# Patient Record
Sex: Male | Born: 1990 | Race: White | Hispanic: No | Marital: Single | Smoking: Never smoker
Health system: Southern US, Academic
[De-identification: ages and names within clinical notes are randomized; demographics above are authoritative.]

## PROBLEM LIST (undated history)

## (undated) DIAGNOSIS — I10 Essential (primary) hypertension: Secondary | ICD-10-CM

## (undated) DIAGNOSIS — F70 Mild intellectual disabilities: Secondary | ICD-10-CM

## (undated) DIAGNOSIS — R569 Unspecified convulsions: Secondary | ICD-10-CM

## (undated) HISTORY — PX: HX ADENOIDECTOMY: SHX29

---

## 2011-03-23 ENCOUNTER — Emergency Department (HOSPITAL_COMMUNITY)
Admission: EM | Admit: 2011-03-23 | Discharge: 2011-03-23 | Disposition: A | Discharge status: Home / Self Care | Payer: Medicaid - Out of State | Attending: Emergency Medicine | Admitting: Emergency Medicine

## 2011-03-23 DIAGNOSIS — L298 Other pruritus: Secondary | ICD-10-CM | POA: Insufficient documentation

## 2011-03-23 DIAGNOSIS — L2989 Other pruritus: Secondary | ICD-10-CM | POA: Insufficient documentation

## 2011-03-23 DIAGNOSIS — Z79899 Other long term (current) drug therapy: Secondary | ICD-10-CM | POA: Insufficient documentation

## 2011-03-23 DIAGNOSIS — F341 Dysthymic disorder: Secondary | ICD-10-CM | POA: Insufficient documentation

## 2011-03-23 DIAGNOSIS — R21 Rash and other nonspecific skin eruption: Secondary | ICD-10-CM | POA: Insufficient documentation

## 2011-03-23 DIAGNOSIS — M7989 Other specified soft tissue disorders: Secondary | ICD-10-CM | POA: Insufficient documentation

## 2011-03-23 DIAGNOSIS — L02419 Cutaneous abscess of limb, unspecified: Secondary | ICD-10-CM | POA: Insufficient documentation

## 2011-03-23 DIAGNOSIS — M79609 Pain in unspecified limb: Secondary | ICD-10-CM | POA: Insufficient documentation

## 2011-03-25 ENCOUNTER — Inpatient Hospital Stay (INDEPENDENT_AMBULATORY_CARE_PROVIDER_SITE_OTHER)
Admission: RE | Admit: 2011-03-25 | Discharge: 2011-03-25 | Disposition: A | Discharge status: Home / Self Care | Payer: Medicare Other | Source: Ambulatory Visit | Attending: Family Medicine | Admitting: Family Medicine

## 2011-03-25 DIAGNOSIS — L02419 Cutaneous abscess of limb, unspecified: Secondary | ICD-10-CM

## 2011-09-10 ENCOUNTER — Emergency Department (HOSPITAL_COMMUNITY): Payer: Medicare Other

## 2011-09-10 ENCOUNTER — Encounter (HOSPITAL_COMMUNITY): Payer: Self-pay

## 2011-09-10 ENCOUNTER — Emergency Department (HOSPITAL_COMMUNITY)
Admission: EM | Admit: 2011-09-10 | Discharge: 2011-09-10 | Disposition: A | Discharge status: Home / Self Care | Payer: Medicare Other | Attending: Emergency Medicine | Admitting: Emergency Medicine

## 2011-09-10 DIAGNOSIS — R221 Localized swelling, mass and lump, neck: Secondary | ICD-10-CM | POA: Insufficient documentation

## 2011-09-10 DIAGNOSIS — R220 Localized swelling, mass and lump, head: Secondary | ICD-10-CM | POA: Insufficient documentation

## 2011-09-10 DIAGNOSIS — F70 Mild intellectual disabilities: Secondary | ICD-10-CM | POA: Insufficient documentation

## 2011-09-10 DIAGNOSIS — S0993XA Unspecified injury of face, initial encounter: Secondary | ICD-10-CM

## 2011-09-10 HISTORY — DX: Unspecified convulsions: R56.9

## 2011-09-10 HISTORY — DX: Mild intellectual disabilities: F70

## 2011-09-10 MED ORDER — TETANUS-DIPHTH-ACELL PERTUSSIS 5-2.5-18.5 LF-MCG/0.5 IM SUSP
0.5000 mL | Freq: Once | INTRAMUSCULAR | Status: AC
  Administered 2011-09-10: 0.5 mL via INTRAMUSCULAR
  Filled 2011-09-10: qty 0.5

## 2011-09-10 MED ORDER — IBUPROFEN 800 MG PO TABS
800.0000 mg | ORAL_TABLET | Freq: Once | ORAL | Status: AC
  Administered 2011-09-10: 800 mg via ORAL
  Filled 2011-09-10: qty 1

## 2011-09-10 MED ORDER — IBUPROFEN 600 MG PO TABS: 600.0000 mg | ORAL_TABLET | Freq: Four times a day (QID) | ORAL | Status: AC | PRN
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
## 2011-09-10 NOTE — ED Provider Notes (Signed)
Medical screening examination/treatment/procedure(s) were performed by non-physician practitioner and as supervising physician I was immediately available for consultation/collaboration.   Jeslynn Hollander A. Laketa Sandoz, MD 09/10/11 1907 
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
## 2011-10-18 ENCOUNTER — Encounter (HOSPITAL_COMMUNITY): Payer: Self-pay | Admitting: Emergency Medicine

## 2011-10-18 ENCOUNTER — Emergency Department (HOSPITAL_COMMUNITY)
Admission: EM | Admit: 2011-10-18 | Discharge: 2011-10-19 | Disposition: A | Discharge status: Home / Self Care | Payer: Medicare Other | Attending: Emergency Medicine | Admitting: Emergency Medicine

## 2011-10-18 DIAGNOSIS — R569 Unspecified convulsions: Secondary | ICD-10-CM | POA: Insufficient documentation

## 2011-10-18 DIAGNOSIS — F70 Mild intellectual disabilities: Secondary | ICD-10-CM | POA: Insufficient documentation

## 2011-10-18 DIAGNOSIS — L02219 Cutaneous abscess of trunk, unspecified: Secondary | ICD-10-CM | POA: Insufficient documentation

## 2011-10-18 DIAGNOSIS — L02213 Cutaneous abscess of chest wall: Secondary | ICD-10-CM

## 2011-10-18 NOTE — ED Notes (Signed)
Pt c/o abcess to right upper abd. Onset Mon.  [facility] has drained some

## 2011-10-19 MED ORDER — CLINDAMYCIN HCL 150 MG PO CAPS: 300.0000 mg | ORAL_CAPSULE | Freq: Three times a day (TID) | ORAL | Status: AC

## 2011-10-19 NOTE — Discharge Instructions (Signed)
You were seen and treated today for a skin infection called an Abscess. Use warm compress over the area for 5 times a day for 20 minutes to help increase blood flow and fight the infection. Please take an antibiotic that you were prescribed for the full length of time. Return if you have any increased redness, swelling or pain to the area.  Abscess An abscess (boil or furuncle) is an infected area that contains a collection of pus.  SYMPTOMS Signs and symptoms of an abscess include pain, tenderness, redness, or hardness. You may feel a moveable soft area under your skin. An abscess can occur anywhere in the body.  TREATMENT  A surgical cut (incision) may be made over your abscess to drain the pus. Gauze may be packed into the space or a drain may be looped through the abscess cavity (pocket). This provides a drain that will allow the cavity to heal from the inside outwards. The abscess may be painful for a few days, but should feel much better if it was drained.  Your abscess, if seen early, may not have localized and may not have been drained. If not, another appointment may be required if it does not get better on its own or with medications. HOME CARE INSTRUCTIONS   Only take over-the-counter or prescription medicines for pain, discomfort, or fever as directed by your caregiver.   Take your antibiotics as directed if they were prescribed. Finish them even if you start to feel better.   Keep the skin and clothes clean around your abscess.   If the abscess was drained, you will need to use gauze dressing to collect any draining pus. Dressings will typically need to be changed 3 or more times a day.   The infection may spread by skin contact with others. Avoid skin contact as much as possible.   Practice good hygiene. This includes regular hand washing, cover any draining skin lesions, and do not share personal care items.   If you participate in sports, do not share athletic equipment,  towels, whirlpools, or personal care items. Shower after every practice or tournament.   If a draining area cannot be adequately covered:   Do not participate in sports.   Children should not participate in day care until the wound has healed or drainage stops.   If your caregiver has given you a follow-up appointment, it is very important to keep that appointment. Not keeping the appointment could result in a much worse infection, chronic or permanent injury, pain, and disability. If there is any problem keeping the appointment, you must call back to this facility for assistance.  SEEK MEDICAL CARE IF:   You develop increased pain, swelling, redness, drainage, or bleeding in the wound site.   You develop signs of generalized infection including muscle aches, chills, fever, or a general ill feeling.   You have an oral temperature above 102 F (38.9 C).  MAKE SURE YOU:   Understand these instructions.   Will watch your condition.   Will get help right away if you are not doing well or get worse.  Document Released: 05/03/2005 Document Revised: 07/13/2011 Document Reviewed: 02/25/2008 [facility] Patient Information 2012 [place], [place].

## 2011-10-19 NOTE — ED Provider Notes (Signed)
History     CSN: [id number]  Arrival date & time 10/18/11  2139   First MD Initiated Contact with Patient 10/19/11 0113      Chief Complaint  Patient presents with  . Abscess    HPI  History provided by the patient and father. Patient is a 21 year old male with history of seizure disorder who presents with concerns for skin infection on right chest wall the past 3 days.  Pt first noticed a small "pimple" like lesion to rt chest wall under nipple area.  The area began to have swelling and increassed redness.  Pt reports trying to pop the area by using a needle.  He reports small amounts of pus coming out but by the next day had increased redness and pain to area.  Pt denies having any fever, chills, or sweats.  Pt has had similar lesions on skin before requiring I&D.  Pt denies any other aggravating or alleviating factors.    Past Medical History  Diagnosis Date  . Mental retardation, mild (I.Q. 50-70)   . Seizures     History reviewed. No pertinent past surgical history.  No family history on file.  History  Substance Use Topics  . Smoking status: Not on file  . Smokeless tobacco: Not on file  . Alcohol Use: Yes      Review of Systems  Constitutional: Negative for fever and chills.  All other systems reviewed and are negative.    Allergies  Review of patient's allergies indicates no known allergies.  Home Medications   Current Outpatient Rx  Name Route Sig Dispense Refill  . CYCLOBENZAPRINE HCL 5 MG PO TABS Oral Take 5 mg by mouth 3 (three) times daily as needed. For sleep    . IBUPROFEN 200 MG PO TABS Oral Take 200 mg by mouth every 6 (six) hours as needed. For pain      BP 140/94  Pulse 95  Temp(Src) 99.5 F (37.5 C) (Oral)  Resp 18  SpO2 97%  Physical Exam  Nursing note and vitals reviewed. Constitutional: He appears well-developed and well-nourished. No distress.  HENT:  Head: Normocephalic.  Cardiovascular: Normal rate and regular rhythm.     Pulmonary/Chest: Effort normal and breath sounds normal. No respiratory distress. He has no wheezes. He has no rales.         2 cm fluctuant nodule with surrounding erythema and induration of skin.  Area TTP.  Skin: Skin is warm.  Psychiatric: He has a normal mood and affect. His behavior is normal.    ED Course  Procedures   INCISION AND DRAINAGE Performed by: Angus Seller Consent: Verbal consent obtained. Risks and benefits: risks, benefits and alternatives were discussed Type: abscess  Body area: right chest wall  Anesthesia: local infiltration  Local anesthetic: lidocaine 2% with epinephrine  Anesthetic total: 4 ml  Complexity: complex Blunt dissection to break up loculations  Drainage: purulent  Drainage amount: small to moderate  Packing material: none  Patient tolerance: Patient tolerated the procedure well with no immediate complications.       1. Abscess of chest wall       MDM  1:40 AM patient seen and evaluated. Patient no acute distress.        Angus Seller, [place] 10/19/11 [phone]

## 2011-10-20 NOTE — ED Provider Notes (Signed)
Medical screening examination/treatment/procedure(s) were performed by non-physician practitioner and as supervising physician I was immediately available for consultation/collaboration.  Rickelle Sylvestre T Graceann Boileau, MD 10/20/11 0901 

## 2013-04-02 ENCOUNTER — Encounter (HOSPITAL_COMMUNITY): Payer: Self-pay | Admitting: *Deleted

## 2013-04-02 ENCOUNTER — Emergency Department (HOSPITAL_COMMUNITY)
Admission: EM | Admit: 2013-04-02 | Discharge: 2013-04-02 | Disposition: A | Discharge status: Home / Self Care | Payer: Medicare Other | Attending: Emergency Medicine | Admitting: Emergency Medicine

## 2013-04-02 DIAGNOSIS — Z8669 Personal history of other diseases of the nervous system and sense organs: Secondary | ICD-10-CM | POA: Insufficient documentation

## 2013-04-02 DIAGNOSIS — L0291 Cutaneous abscess, unspecified: Secondary | ICD-10-CM

## 2013-04-02 DIAGNOSIS — R110 Nausea: Secondary | ICD-10-CM | POA: Insufficient documentation

## 2013-04-02 DIAGNOSIS — L02419 Cutaneous abscess of limb, unspecified: Secondary | ICD-10-CM | POA: Insufficient documentation

## 2013-04-02 DIAGNOSIS — R21 Rash and other nonspecific skin eruption: Secondary | ICD-10-CM | POA: Insufficient documentation

## 2013-04-02 DIAGNOSIS — R509 Fever, unspecified: Secondary | ICD-10-CM | POA: Insufficient documentation

## 2013-04-02 DIAGNOSIS — Z8659 Personal history of other mental and behavioral disorders: Secondary | ICD-10-CM | POA: Insufficient documentation

## 2013-04-02 MED ORDER — SULFAMETHOXAZOLE-TRIMETHOPRIM 800-160 MG PO TABS: 1.0000 | ORAL_TABLET | Freq: Two times a day (BID) | ORAL | Status: AC

## 2013-04-02 MED ORDER — CEPHALEXIN 500 MG PO CAPS: 500.0000 mg | ORAL_CAPSULE | Freq: Four times a day (QID) | ORAL | Status: AC

## 2013-04-02 NOTE — ED Provider Notes (Signed)
CSN: [id number]     Arrival date & time 04/02/13  0114 History   First MD Initiated Contact with Patient 04/02/13 0124     Chief Complaint  Patient presents with  . Recurrent Skin Infections   (Consider location/radiation/quality/duration/timing/severity/associated sxs/prior Treatment) HPI Comments: Patient is a 22 year old male who presents today with 2 weeks of worsening abscess. He reports the pain feels similar to when he got a bug bite on his other leg. The area of erythema surrounding his abscess has continuned to grow over the past 2 weeks. He reports nausea without vomiting. He has not done anything to make his pain better. Walking makes his pain worse. He had a subjective fever today. Afebrile in triage.   The history is provided by the patient. No language interpreter was used.    Past Medical History  Diagnosis Date  . Mental retardation, mild (I.Q. 50-70)   . Seizures    History reviewed. No pertinent past surgical history. No family history on file. History  Substance Use Topics  . Smoking status: Never Smoker   . Smokeless tobacco: Not on file  . Alcohol Use: Yes    Review of Systems  Constitutional: Positive for fever (subjective). Negative for chills.  Respiratory: Negative for shortness of breath.   Cardiovascular: Negative for chest pain.  Gastrointestinal: Positive for nausea. Negative for vomiting and abdominal pain.  Skin: Positive for rash.  All other systems reviewed and are negative.    Allergies  Naproxen  Home Medications  No current outpatient prescriptions on file. BP 130/90  Pulse 88  Temp(Src) 98.3 F (36.8 C) (Oral)  Resp 18  SpO2 96% Physical Exam  Nursing note and vitals reviewed. Constitutional: He is oriented to person, place, and time. He appears well-developed and well-nourished. No distress.  HENT:  Head: Normocephalic and atraumatic.  Right Ear: External ear normal.  Left Ear: External ear normal.  Nose: Nose normal.   Eyes: Conjunctivae are normal.  Neck: Normal range of motion. No tracheal deviation present.  Cardiovascular: Normal rate, regular rhythm and normal heart sounds.   Pulmonary/Chest: Effort normal and breath sounds normal. No stridor.  Abdominal: Soft. He exhibits no distension. There is no tenderness.  Musculoskeletal: Normal range of motion.  Neurological: He is alert and oriented to person, place, and time.  Skin: Skin is warm and dry. He is not diaphoretic.  6 cm area of erythema with central 2 cm area of fluctuance on medial left thigh. No streaking. TTP. No induration.   Psychiatric: He has a normal mood and affect. His behavior is normal.    ED Course  Procedures (including critical care time)  INCISION AND DRAINAGE Performed by: Junious Silk Consent: Verbal consent obtained. Risks and benefits: risks, benefits and alternatives were discussed Type: abscess  Body area: left thigh  Anesthesia: local infiltration  Incision was made with a scalpel.  Local anesthetic: lidocaine 2% 1% epinephrine  Anesthetic total: 3 ml  Complexity: complex Blunt dissection to break up loculations  Drainage: purulent  Drainage amount: moderate  Patient tolerance: Patient tolerated the procedure well with no immediate complications.     Labs Review Labs Reviewed - No data to display Imaging Review No results found.  MDM   1. Abscess and cellulitis    Patient with skin abscess amenable to incision and drainage.  Abscess was not large enough to warrant packing or drain,  wound recheck in 2 days. Encouraged home warm soaks and flushing.  Signs of cellulitis  is surrounding skin.  Will d/c to home.  Given Bactrim and Keflex.   Mora Bellman, PA-C 04/02/13 [phone]

## 2013-04-02 NOTE — ED Notes (Signed)
Patient with an abcess to left inner thigh.  Would is intact

## 2013-04-02 NOTE — ED Provider Notes (Signed)
Medical screening examination/treatment/procedure(s) were performed by non-physician practitioner and as supervising physician I was immediately available for consultation/collaboration.   Echo Allsbrook, MD 04/02/13 0640 

## 2013-04-04 DIAGNOSIS — R197 Diarrhea, unspecified: Secondary | ICD-10-CM | POA: Insufficient documentation

## 2013-04-04 DIAGNOSIS — Z792 Long term (current) use of antibiotics: Secondary | ICD-10-CM | POA: Insufficient documentation

## 2013-04-04 DIAGNOSIS — R112 Nausea with vomiting, unspecified: Secondary | ICD-10-CM | POA: Insufficient documentation

## 2013-04-04 DIAGNOSIS — Z8669 Personal history of other diseases of the nervous system and sense organs: Secondary | ICD-10-CM | POA: Insufficient documentation

## 2013-04-04 DIAGNOSIS — R109 Unspecified abdominal pain: Secondary | ICD-10-CM | POA: Insufficient documentation

## 2013-04-04 DIAGNOSIS — Z8659 Personal history of other mental and behavioral disorders: Secondary | ICD-10-CM | POA: Insufficient documentation

## 2013-04-05 ENCOUNTER — Emergency Department (HOSPITAL_COMMUNITY)
Admission: EM | Admit: 2013-04-05 | Discharge: 2013-04-05 | Disposition: A | Discharge status: Home / Self Care | Payer: Medicare Other | Attending: Emergency Medicine | Admitting: Emergency Medicine

## 2013-04-05 ENCOUNTER — Encounter (HOSPITAL_COMMUNITY): Payer: Self-pay | Admitting: *Deleted

## 2013-04-05 DIAGNOSIS — R112 Nausea with vomiting, unspecified: Secondary | ICD-10-CM

## 2013-04-05 LAB — URINALYSIS, ROUTINE W REFLEX MICROSCOPIC
Bilirubin Urine: NEGATIVE
Glucose, UA: NEGATIVE mg/dL
Hgb urine dipstick: NEGATIVE
Specific Gravity, Urine: 1.021 (ref 1.005–1.030)
Urobilinogen, UA: 1 mg/dL (ref 0.0–1.0)
pH: 7 (ref 5.0–8.0)

## 2013-04-05 LAB — CBC WITH DIFFERENTIAL/PLATELET
Basophils Absolute: 0.1 10*3/uL (ref 0.0–0.1)
Basophils Relative: 1 % (ref 0–1)
Eosinophils Absolute: 0.7 10*3/uL (ref 0.0–0.7)
Eosinophils Relative: 9 % — ABNORMAL HIGH (ref 0–5)
HCT: 39.8 % (ref 39.0–52.0)
Lymphocytes Relative: 35 % (ref 12–46)
MCHC: 33.7 g/dL (ref 30.0–36.0)
MCV: 85 fL (ref 78.0–100.0)
Monocytes Absolute: 0.9 10*3/uL (ref 0.1–1.0)
Platelets: 288 10*3/uL (ref 150–400)
RDW: 12.9 % (ref 11.5–15.5)
WBC: 8.3 10*3/uL (ref 4.0–10.5)

## 2013-04-05 LAB — COMPREHENSIVE METABOLIC PANEL
ALT: 29 U/L (ref 0–53)
AST: 20 U/L (ref 0–37)
Albumin: 3.8 g/dL (ref 3.5–5.2)
Calcium: 9.5 mg/dL (ref 8.4–10.5)
Creatinine, Ser: 0.76 mg/dL (ref 0.50–1.35)
GFR calc non Af Amer: >90 mL/min (ref >90)
Sodium: 139 mEq/L (ref 135–145)
Total Protein: 6.9 g/dL (ref 6.0–8.3)

## 2013-04-05 MED ORDER — ONDANSETRON 8 MG PO TBDP: 8.0000 mg | ORAL_TABLET | Freq: Three times a day (TID) | ORAL | Status: AC | PRN

## 2013-04-05 MED ORDER — ONDANSETRON HCL 4 MG/2ML IJ SOLN
4.0000 mg | Freq: Once | INTRAMUSCULAR | Status: AC
  Administered 2013-04-05: 4 mg via INTRAVENOUS
  Filled 2013-04-05: qty 2

## 2013-04-05 MED ORDER — SODIUM CHLORIDE 0.9 % IV BOLUS (SEPSIS)
1000.0000 mL | Freq: Once | INTRAVENOUS | Status: AC
  Administered 2013-04-05: 1000 mL via INTRAVENOUS

## 2013-04-05 MED ORDER — LOPERAMIDE HCL 2 MG PO CAPS: 2.0000 mg | ORAL_CAPSULE | Freq: Four times a day (QID) | ORAL | Status: AC | PRN

## 2013-04-05 NOTE — ED Provider Notes (Signed)
CSN: [id number]     Arrival date & time 04/04/13  2346 History   First MD Initiated Contact with Patient 04/05/13 0024     Chief Complaint  Patient presents with  . Abdominal Pain  . Emesis  . Diarrhea   (Consider location/radiation/quality/duration/timing/severity/associated sxs/prior Treatment) HPI 22 year old male presents emergency department complaining of one day of nausea, vomiting, and diarrhea.  He reports symptoms started yesterday around 5 PM after arriving on the train from [place].  He denies any sick contacts, no unusual foods, no fevers, no chills.  No abdominal pain.  He reports he vomited once with breakfast, and once after lunch.  He estimates he has had upwards of 20 loose stools.  Patient seen earlier in the week for abscess, placed on Keflex and Bactrim.  Patient reports to me.  He has started these medications, however, reported to pharmacy tech, but he has not filled the prescriptions.  He reports his abscess is much better after the I&D. Past Medical History  Diagnosis Date  . Mental retardation, mild (I.Q. 50-70)   . Seizures    History reviewed. No pertinent past surgical history. History reviewed. No pertinent family history. History  Substance Use Topics  . Smoking status: Never Smoker   . Smokeless tobacco: Not on file  . Alcohol Use: No    Review of Systems  All other systems reviewed and are negative.    Allergies  Naproxen  Home Medications   Current Outpatient Rx  Name  Route  Sig  Dispense  Refill  . cephALEXin (KEFLEX) 500 MG capsule   Oral   Take 1 capsule (500 mg total) by mouth 4 (four) times daily.   40 capsule   0   . loperamide (IMODIUM) 2 MG capsule   Oral   Take 1 capsule (2 mg total) by mouth 4 (four) times daily as needed for diarrhea or loose stools.   12 capsule   0   . ondansetron (ZOFRAN ODT) 8 MG disintegrating tablet   Oral   Take 1 tablet (8 mg total) by mouth every 8 (eight) hours as needed for nausea.  20 tablet   0   . sulfamethoxazole-trimethoprim (SEPTRA DS) 800-160 MG per tablet   Oral   Take 1 tablet by mouth every 12 (twelve) hours.   20 tablet   0    BP 132/81  Pulse 76  Temp(Src) 98 F (36.7 C) (Oral)  Resp 18  Ht 5\' 7"  (1.702 m)  Wt 180 lb (81.647 kg)  BMI 28.19 kg/m2  SpO2 97% Physical Exam  Nursing note and vitals reviewed. Constitutional: He is oriented to person, place, and time. He appears well-developed and well-nourished.  HENT:  Head: Normocephalic and atraumatic.  Nose: Nose normal.  Mouth/Throat: Oropharynx is clear and moist.  Eyes: Conjunctivae and EOM are normal. Pupils are equal, round, and reactive to light.  Neck: Normal range of motion. Neck supple. No JVD present. No tracheal deviation present. No thyromegaly present.  Cardiovascular: Normal rate, regular rhythm, normal heart sounds and intact distal pulses.  Exam reveals no gallop and no friction rub.   No murmur heard. Pulmonary/Chest: Effort normal and breath sounds normal. No stridor. No respiratory distress. He has no wheezes. He has no rales. He exhibits no tenderness.  Abdominal: Soft. He exhibits no distension and no mass. There is no tenderness. There is no rebound and no guarding.  Hyperactive bowel sounds  Musculoskeletal: Normal range of motion. He exhibits no edema and  no tenderness.  Lymphadenopathy:    He has no cervical adenopathy.  Neurological: He is alert and oriented to person, place, and time. He exhibits normal muscle tone. Coordination normal.  Skin: Skin is warm and dry. No rash noted. No erythema. No pallor.  Psychiatric: He has a normal mood and affect. His behavior is normal. Judgment and thought content normal.    ED Course  Procedures (including critical care time) Labs Review Labs Reviewed  CBC WITH DIFFERENTIAL - Abnormal; Notable for the following:    Eosinophils Relative 9 (*)    All other components within normal limits  COMPREHENSIVE METABOLIC PANEL -  Abnormal; Notable for the following:    Glucose, Bld 111 (*)    Total Bilirubin 0.2 (*)    All other components within normal limits  URINALYSIS, ROUTINE W REFLEX MICROSCOPIC   Imaging Review No results found.  MDM   1. Nausea vomiting and diarrhea    22 year old male with one day of nausea, vomiting, and diarrhea.  Labs unremarkable.  He has had no further vomiting or diarrhea here in the emergency department.  He has tolerated crackers and ginger ale.  Will discharge home with when necessary Zofran and Imodium.    Olivia Mackie, MD 04/05/13 [phone]

## 2013-04-05 NOTE — ED Notes (Signed)
Pt reports periumbilical abd pain and n/v/d that began 0500 yesterday, pt denies any recent ill contact. Denies any fever.

## 2013-04-21 IMAGING — CT CT MAXILLOFACIAL W/O CM
1 series · 16 of 30 positions shown, 20 images · non-contrast
Comparison: None.

CLINICAL DATA: Swelling post blunt trauma.

CT MAXILLOFACIAL WITHOUT CONTRAST
TECHNIQUE: Multidetector CT imaging of the maxillofacial
structures was performed. Multiplanar CT image reconstructions were
also generated.

[Series 3: facial st · axial · 0.34mm/px · z∈[+1012,+1152]mm · 16 of 76 slices shown, 20 images]
[im 3/76  brain]
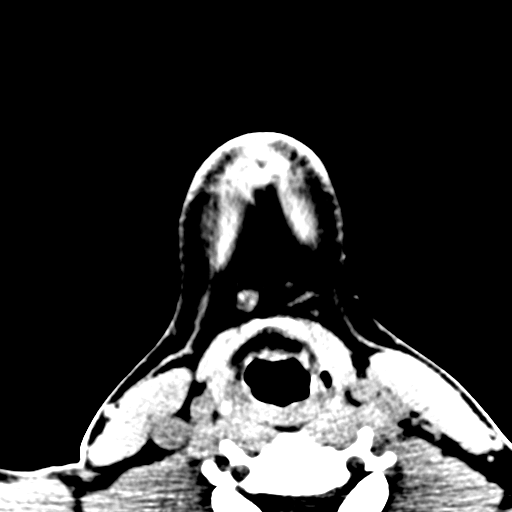
[im 3/76  bone]
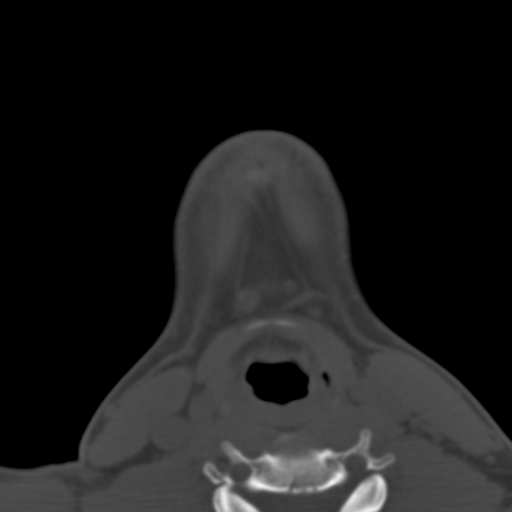
[im 8/76  bone]
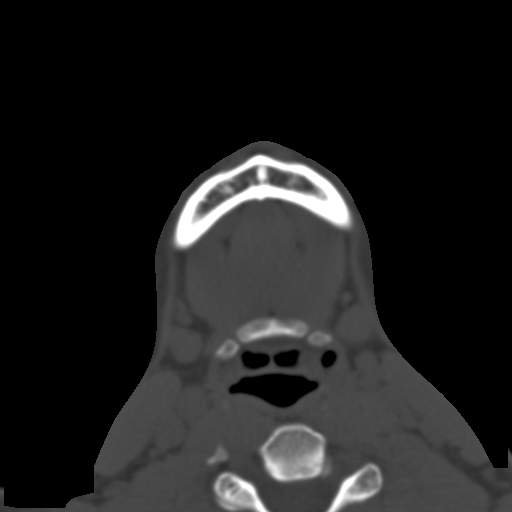
[im 13/76  bone]
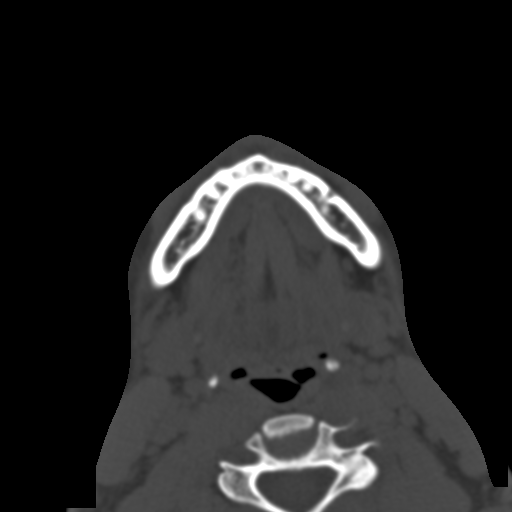
[im 19/76  bone]
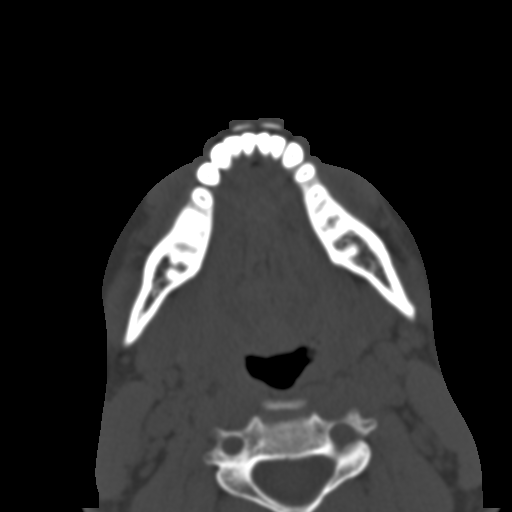
[im 21/76  brain]
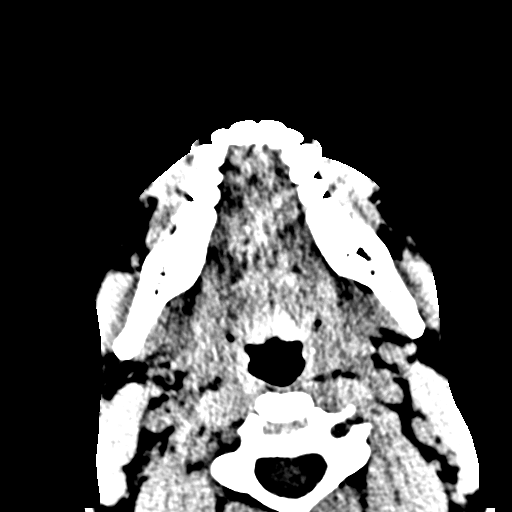
[im 21/76  bone]
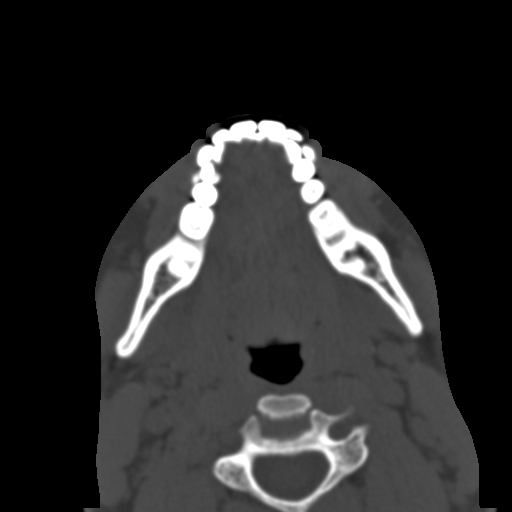
[im 26/76  bone]
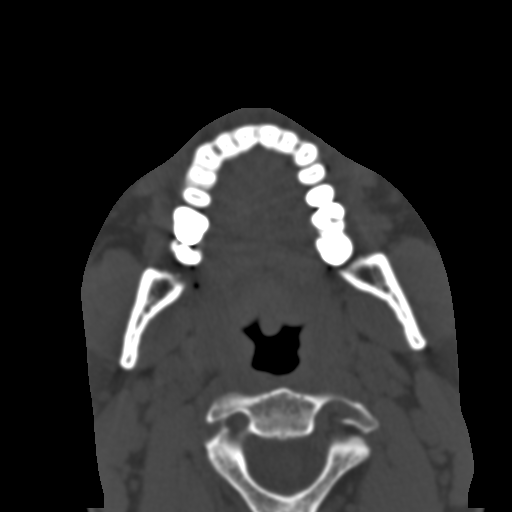
[im 32/76  bone]
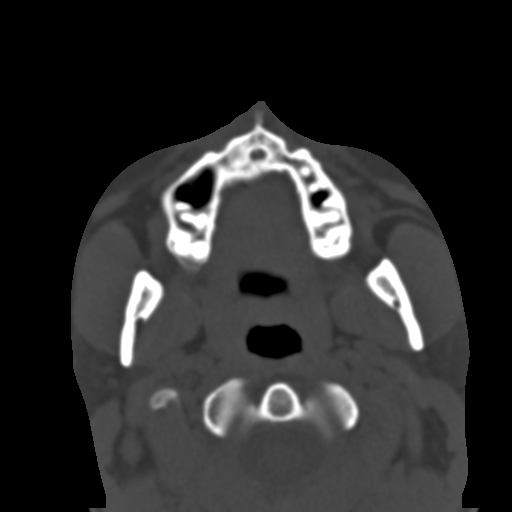
[im 37/76  bone]
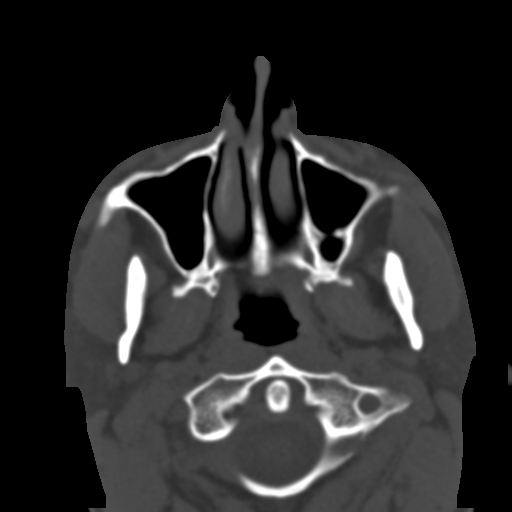
[im 39/76  brain]
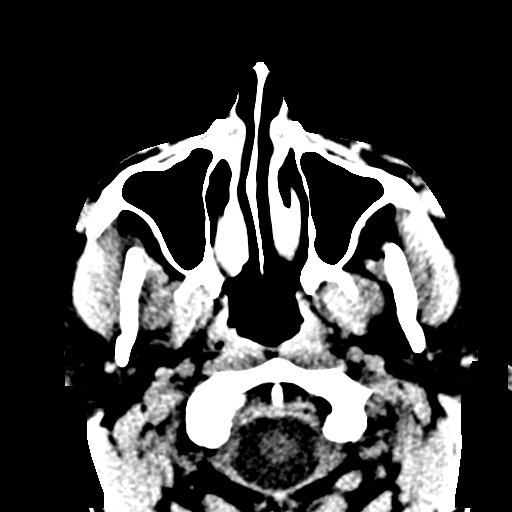
[im 39/76  bone]
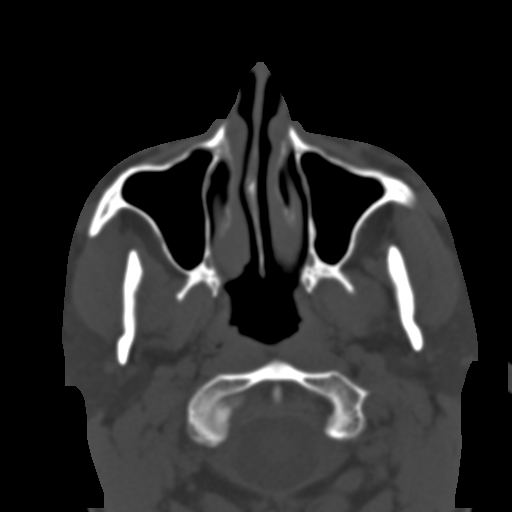
[im 44/76  bone]
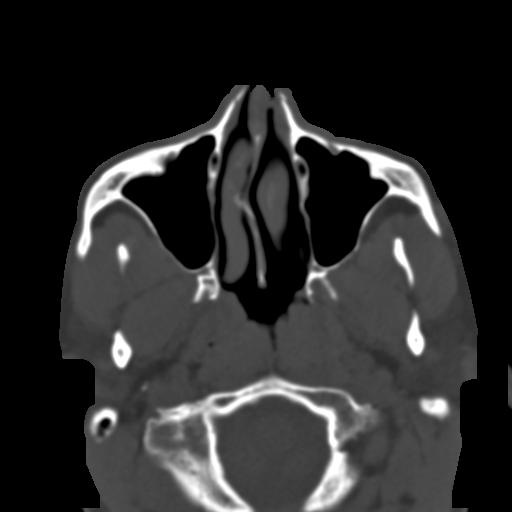
[im 50/76  bone]
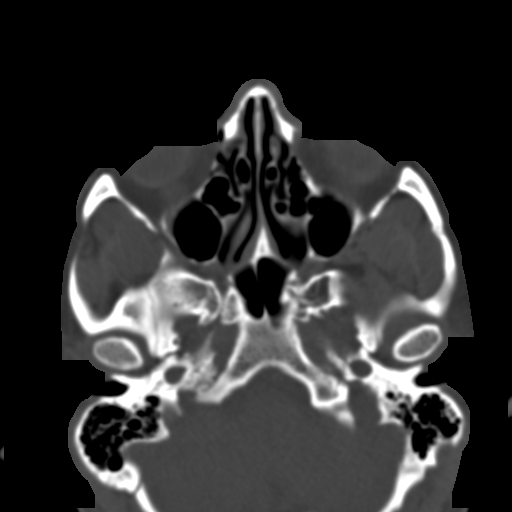
[im 55/76  bone]
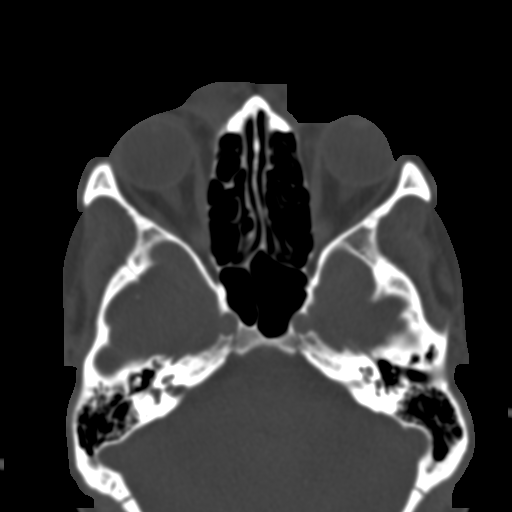
[im 57/76  brain]
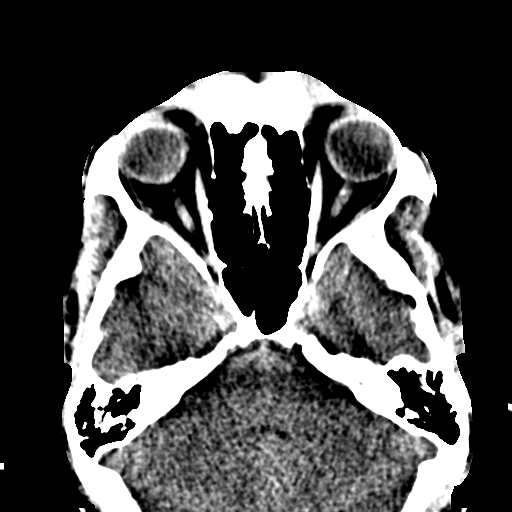
[im 57/76  bone]
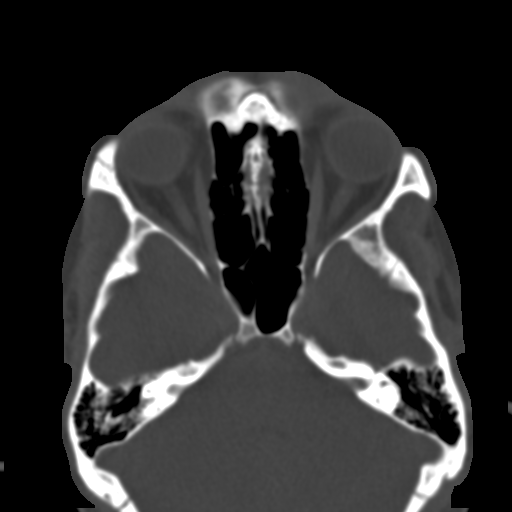
[im 63/76  bone]
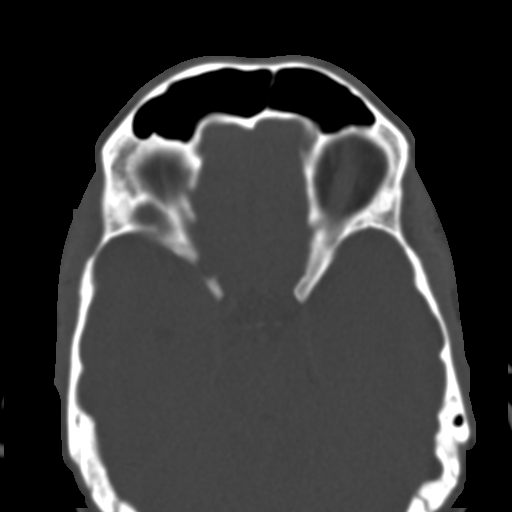
[im 68/76  bone]
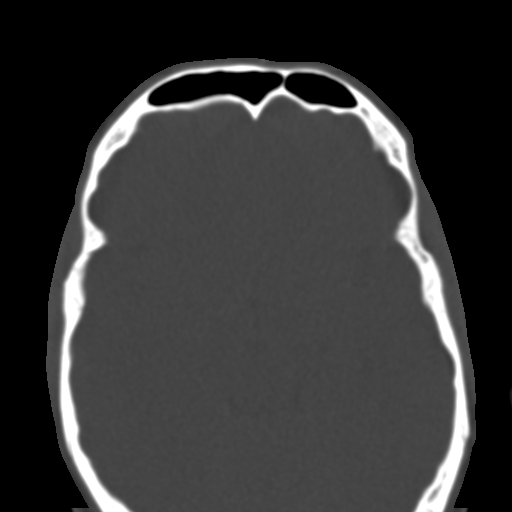
[im 73/76  bone]
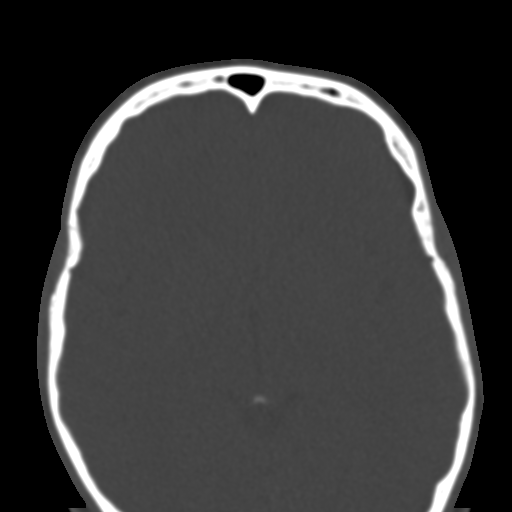

[16 of 30 positions shown; findings below may reference images not displayed]

FINDINGS: Paranasal sinuses are normally developed and well
aerated.  There is mild rightward deviation of the nasal septum
without acute fracture.  Temporomandibular joints seated.  Mandible
intact.  Orbits and globes intact.  Negative for fracture.
IMPRESSION: 1.  Negative.

## 2014-01-16 ENCOUNTER — Emergency Department (HOSPITAL_COMMUNITY): Payer: Self-pay | Admitting: EMERGENCY MEDICINE

## 2019-09-19 ENCOUNTER — Encounter (HOSPITAL_COMMUNITY): Payer: Self-pay | Admitting: Emergency Medicine

## 2019-09-19 ENCOUNTER — Emergency Department (HOSPITAL_COMMUNITY): Payer: Medicare Other

## 2019-09-19 ENCOUNTER — Emergency Department (HOSPITAL_COMMUNITY)
Admission: EM | Admit: 2019-09-19 | Discharge: 2019-09-19 | Disposition: A | Discharge status: Home / Self Care | Payer: Medicare Other | Attending: Emergency Medicine | Admitting: Emergency Medicine

## 2019-09-19 ENCOUNTER — Other Ambulatory Visit: Payer: Self-pay

## 2019-09-19 DIAGNOSIS — Y9301 Activity, walking, marching and hiking: Secondary | ICD-10-CM | POA: Diagnosis not present

## 2019-09-19 DIAGNOSIS — S93401A Sprain of unspecified ligament of right ankle, initial encounter: Secondary | ICD-10-CM | POA: Insufficient documentation

## 2019-09-19 DIAGNOSIS — Y9289 Other specified places as the place of occurrence of the external cause: Secondary | ICD-10-CM | POA: Diagnosis not present

## 2019-09-19 DIAGNOSIS — Y998 Other external cause status: Secondary | ICD-10-CM | POA: Insufficient documentation

## 2019-09-19 DIAGNOSIS — X58XXXA Exposure to other specified factors, initial encounter: Secondary | ICD-10-CM | POA: Insufficient documentation

## 2019-09-19 DIAGNOSIS — S99911A Unspecified injury of right ankle, initial encounter: Secondary | ICD-10-CM | POA: Diagnosis present

## 2019-09-19 MED ORDER — IBUPROFEN 400 MG PO TABS
600.0000 mg | ORAL_TABLET | Freq: Once | ORAL | Status: AC
  Administered 2019-09-19: 600 mg via ORAL
  Filled 2019-09-19: qty 1

## 2019-09-19 NOTE — ED Provider Notes (Signed)
[facility] EMERGENCY DEPARTMENT Provider Note   CSN: [id number] Arrival date & time: 09/19/19  1905     History Chief Complaint  Patient presents with  . Ankle Pain    Kristopher Howell is a 29 y.o. male.  Kristopher Howell is a 29 y.o. 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  He reports that last Sunday at an outside hospital he was told that he had an ankle fracture and was given a cam walker boot but told that he could bear weight.  He reports that he took off the cam walker boot because it was irritating the skin on the inside of his ankle.  He was then arrested and taken to jail last Monday and has been in jail, was transferred to the [facility] jail and then was released today.  He states that he lives in [place] and is trying to find a way to get home.  He states that his ankle boot is at his home in [place] and he reports pain and swelling to the ankle.  Denies any numbness tingling or weakness.  States that he has been walking on the ankle with no brace or support for the past 5 days.  He was told to take ibuprofen or Aleve for his ankle pain but he has not had any while he has been in jail.  He does have some skin irritation on the inside of his ankle.  States that he lives in [place], his phone is dead so he cannot get to his friend's phone number to call to try and get a ride home and this is making him feel very anxious.  He denies any other symptoms or complaints.  No chest pain or shortness of breath.  No fevers or chills.  No other aggravating or alleviating factors.        Past Medical History:  Diagnosis Date  . Mental retardation, mild (I.Q. 50-70)   . Seizures (HCC)     There are no problems to display for this patient.   History reviewed. No pertinent surgical history.     No family history on file.  Social History   Tobacco Use  . Smoking status: Never Smoker   Substance Use Topics  . Alcohol use: No  . Drug use: No    Home Medications Prior to Admission medications   Medication Sig Start Date End Date Taking? Authorizing Provider  cephALEXin (KEFLEX) 500 MG capsule Take 1 capsule (500 mg total) by mouth 4 (four) times daily. 04/02/13   Junious Silk, PA-C  loperamide (IMODIUM) 2 MG capsule Take 1 capsule (2 mg total) by mouth 4 (four) times daily as needed for diarrhea or loose stools. 04/05/13   Marisa Severin, MD  ondansetron (ZOFRAN ODT) 8 MG disintegrating tablet Take 1 tablet (8 mg total) by mouth every 8 (eight) hours as needed for nausea. 04/05/13   Marisa Severin, MD  sulfamethoxazole-trimethoprim (SEPTRA DS) 800-160 MG per tablet Take 1 tablet by mouth every 12 (twelve) hours. 04/02/13   Junious Silk, PA-C    Allergies    Naproxen  Review of Systems   Review of Systems  Constitutional: Negative for chills and fever.  Musculoskeletal: Positive for arthralgias and joint swelling.  Skin: Negative for color change and rash.  Neurological: Negative for weakness and numbness.    Physical Exam Updated Vital Signs BP (!) 136/105 (BP Location: Left Arm)  Pulse (!) 113   Temp 98.4 F (36.9 C) (Oral)   Resp 18   Ht 5\' 7"  (1.702 m)   Wt 131.5 kg   SpO2 97%   BMI 45.42 kg/m   Physical Exam Vitals and nursing note reviewed.  Constitutional:      General: He is not in acute distress.    Appearance: He is well-developed. He is not diaphoretic.     Comments: Patient appears anxious but is in no acute distress  HENT:     Head: Normocephalic and atraumatic.  Eyes:     General:        Right eye: No discharge.        Left eye: No discharge.  Pulmonary:     Effort: Pulmonary effort is normal. No respiratory distress.  Musculoskeletal:     Comments: Right ankle with swelling primarily over the lateral malleolus, tender to palpation, no pitting edema, no significant deformity, 2+ DP and PT pulses, good cap refill, 5/5 strength and  normal sensation, no calf pain or tenderness.  Skin:    General: Skin is warm and dry.     Capillary Refill: Capillary refill takes less than 2 seconds.  Neurological:     Mental Status: He is alert and oriented to person, place, and time.     Coordination: Coordination normal.  Psychiatric:        Mood and Affect: Mood is anxious.        Behavior: Behavior normal.     ED Results / Procedures / Treatments   Labs (all labs ordered are listed, but only abnormal results are displayed) Labs Reviewed - No data to display  EKG None  Radiology DG Ankle Complete Right  Result Date: 09/19/2019 CLINICAL DATA:  Ankle pain EXAM: RIGHT ANKLE - COMPLETE 3+ VIEW COMPARISON:  None. FINDINGS: Diffuse soft tissue swelling. No acute bony abnormality. Specifically, no fracture, subluxation, or dislocation. Joint spaces maintained. IMPRESSION: No acute bony abnormality. Electronically Signed   By: Rolm Baptise M.D.   On: 09/19/2019 19:43    Procedures Procedures (including critical care time)  Medications Ordered in ED Medications  ibuprofen (ADVIL) tablet 600 mg (600 mg Oral Given 09/19/19 2036)    ED Course  I have reviewed the triage vital signs and the nursing notes.  Pertinent labs & imaging results that were available during my care of the patient were reviewed by me and considered in my medical decision making (see chart for details).    MDM Rules/Calculators/A&P                     Patient presents with right ankle swelling, on arrival he is tachycardic but states he is very anxious about trying to find a way back home to [place], patient's tachycardia has persistently improved, he has no chest pain or shortness of breath.  Does have localized swelling over the ankle joint but it does not continue into the calf, he has no calf tenderness and I have no concern for PE or DVT.  X-ray here shows no evidence of ankle fracture, although patient reports that he was told he had a small fracture  to the back of his ankle, exam suggest that he certainly has an ankle sprain, will place him back in a cam walker boot and give dose of ibuprofen for pain.  Patient has an orthopedist he is planning to follow-up with in [place].  Was able to provide patient with a phone charger, he was  able to reach his friend to arrange transport back to [place].  After this his anxiety was significantly relieved and heart rate returned to normal.  He reports improved ankle pain after ibuprofen and brace applied.  At this time patient is stable for discharge home.  Return precautions discussed.  Final Clinical Impression(s) / ED Diagnoses Final diagnoses:  Sprain of right ankle, unspecified ligament, initial encounter    Rx / DC Orders ED Discharge Orders    None       Legrand Rams 09/19/19 2116    Geoffery Lyons, MD 09/19/19 2221

## 2019-09-19 NOTE — ED Triage Notes (Signed)
Pt c/o 9/10 ankle pain and a rash on his right leg. Pt states the ankle was splinted and it was removed but pain is worse.

## 2019-09-19 NOTE — Discharge Instructions (Signed)
Please follow-up with your orthopedist in [place].

## 2019-09-19 NOTE — ED Notes (Signed)
Ortho tech at bedside to apply cam boot

## 2019-09-19 NOTE — ED Notes (Signed)
Ibuprofen given per MAR. Name/DOB verified with pt. Ice pack applied to R ankle. R ankle elevated. Pt provided with phone charger to charge phone for ride home

## 2019-09-19 NOTE — ED Notes (Addendum)
Pt wheeled to ED rm 3 from WR. Pt A&OX4. Breathing easy, non-labored. Speaking in full sentences. Pt here for new cam boot. Pt reports he broke his R ankle and was seen at OSH and given a CAM boot and no crutches. Pt reports he was arrested and taken to jail last Monday and left his boot at home. Pt states he was released today and he has no way to get home and his phone is dead which is why he is requesting a new boot. +swelling and pain to R ankle. Distal pulses palpable. Skin warm and intact. Pt also c/o skin irritation from boot Seen by ED PA at bedside

## 2019-09-19 NOTE — ED Notes (Signed)
Patient verbalizes understanding of discharge instructions. Opportunity for questioning and answers were provided. All questions answered completely. Armband removed by staff, pt discharged from ED. Ambulatory with strong, steady gait from ED. Cam boot in place

## 2019-09-19 NOTE — Progress Notes (Signed)
Orthopedic Tech Progress Note Patient Details:  Kristopher Howell [birth year] [id number]  Ortho Devices Type of Ortho Device: CAM walker Ortho Device/Splint Location: rle Ortho Device/Splint Interventions: Ordered, Application, Adjustment   Post Interventions Patient Tolerated: Well Instructions Provided: Care of device, Adjustment of device   [name] Post 09/19/2019, 9:06 PM

## 2021-04-30 IMAGING — DX DG ANKLE COMPLETE 3+V*R*
3 series · 3 of 3 positions shown · non-contrast
Comparison: None.

CLINICAL DATA: Ankle pain

EXAM:
RIGHT ANKLE - COMPLETE 3+ VIEW

[ankle ap]
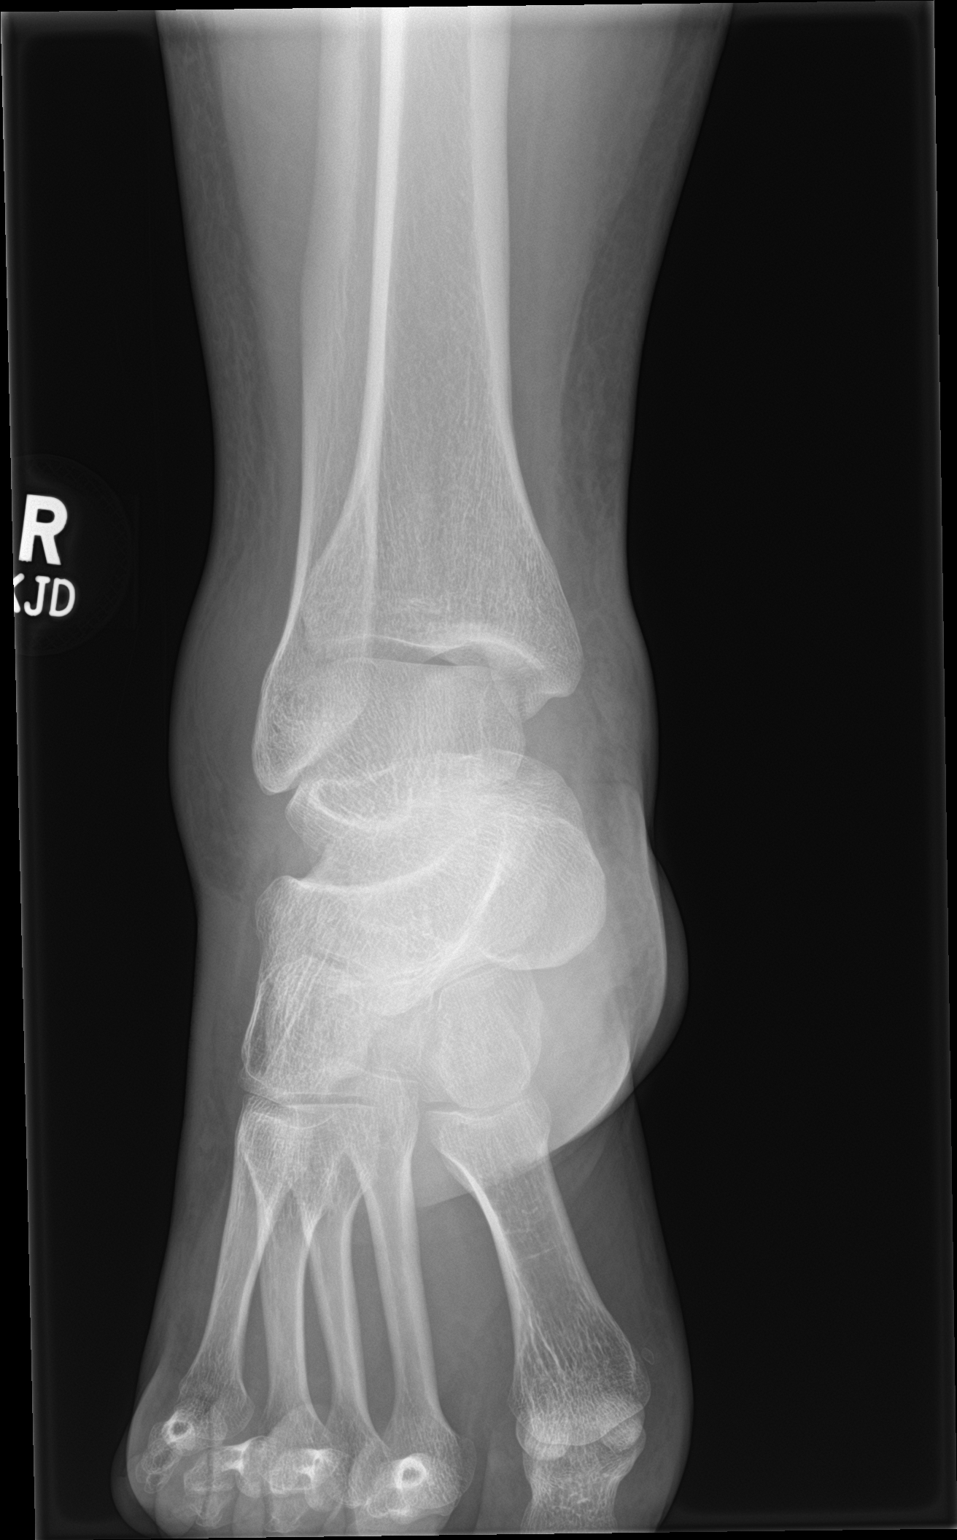

[ankle obl]
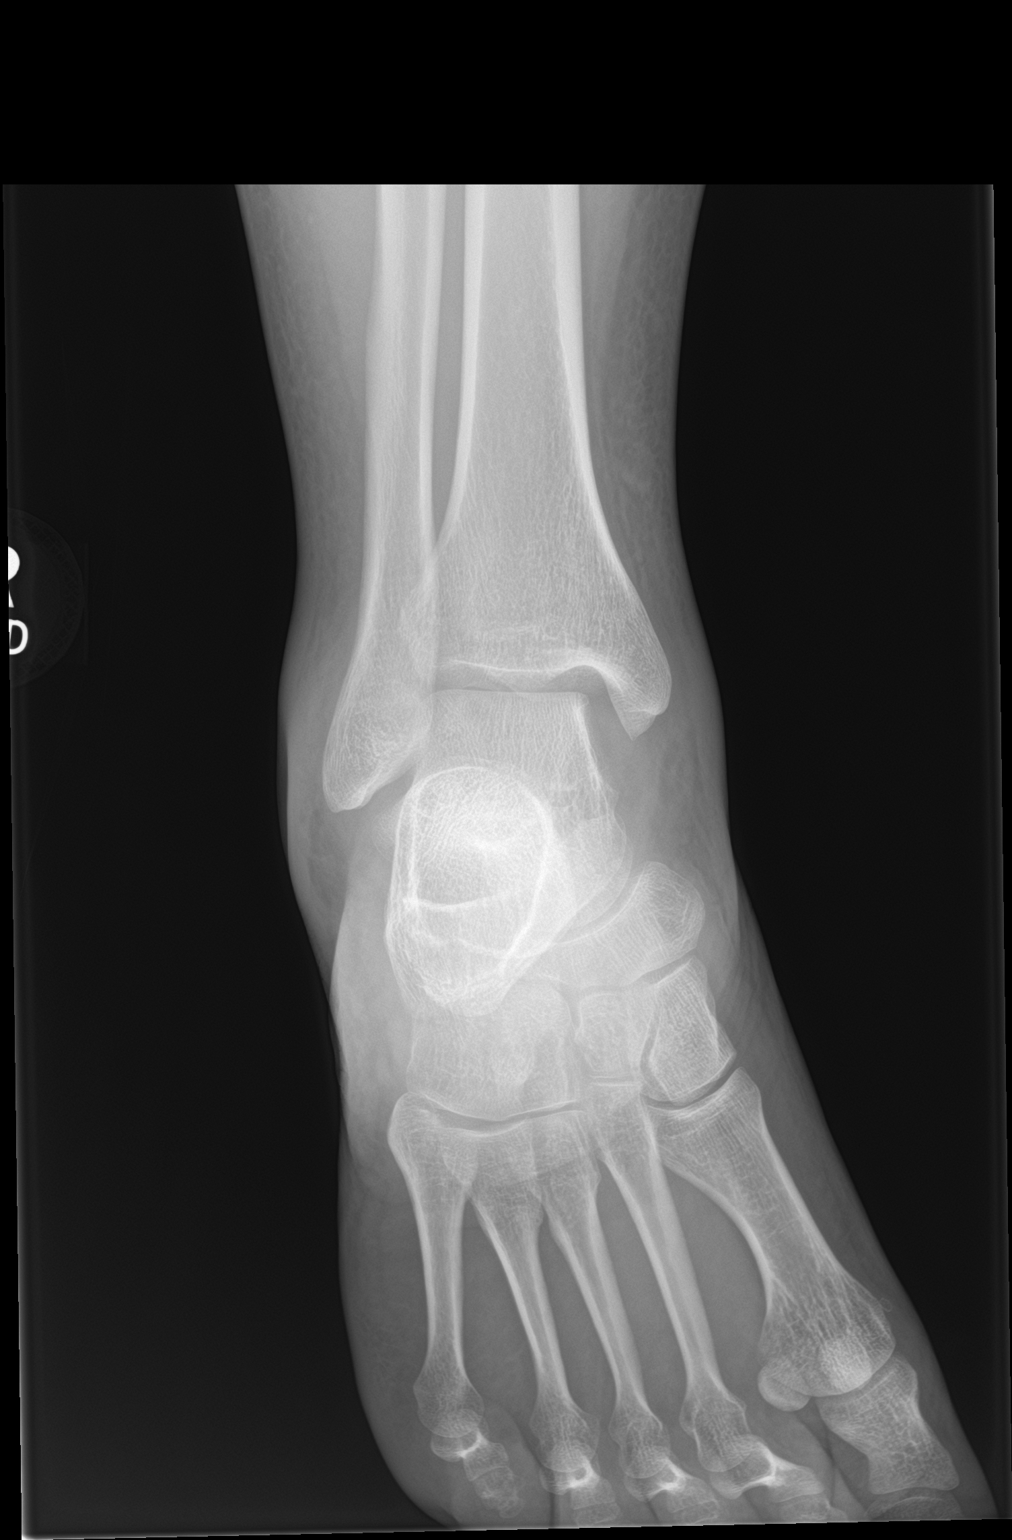

[ankle lat]
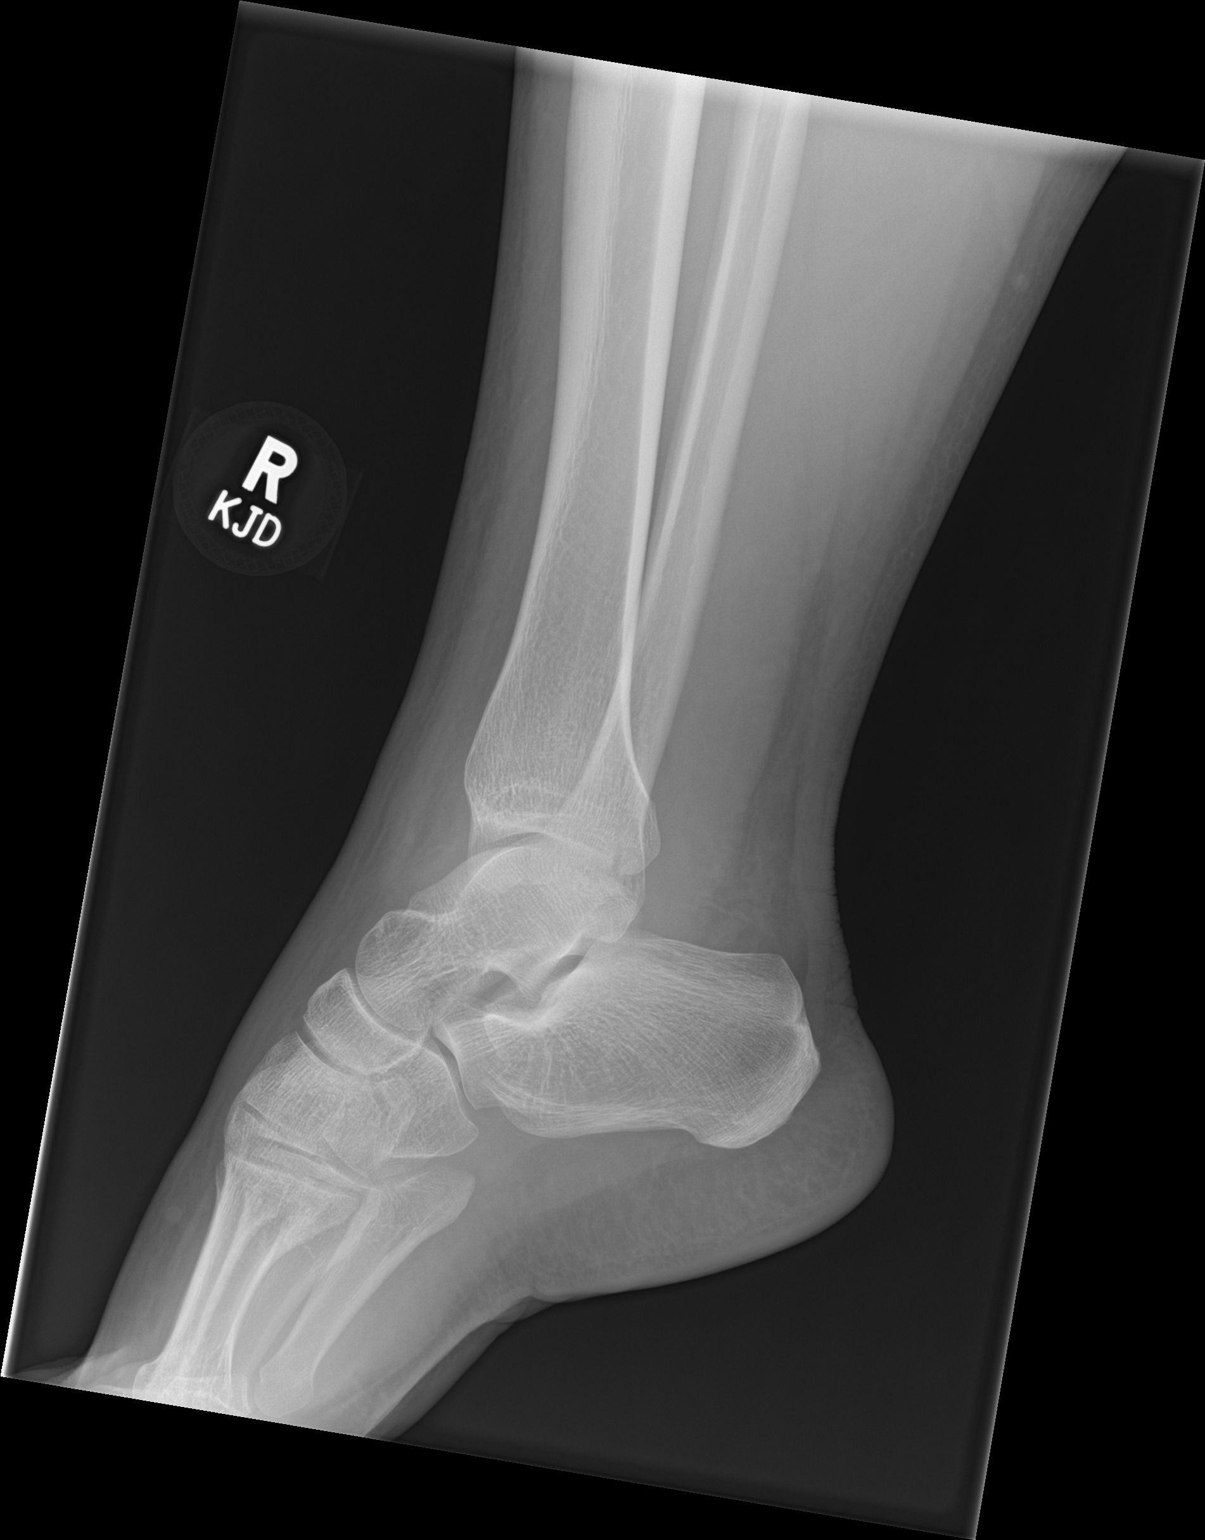

[3 of 3 positions shown; findings below may reference images not displayed]

FINDINGS: Diffuse soft tissue swelling. No acute bony abnormality.
Specifically, no fracture, subluxation, or dislocation. Joint spaces
maintained.
IMPRESSION: No acute bony abnormality.

## 2022-05-10 ENCOUNTER — Encounter (HOSPITAL_COMMUNITY): Payer: Self-pay

## 2022-05-10 ENCOUNTER — Inpatient Hospital Stay
Admission: EM | Admit: 2022-05-10 | Discharge: 2022-05-12 | DRG: 399 | Disposition: A | Discharge status: Home / Self Care | Payer: Commercial Managed Care - PPO | Attending: Family Medicine | Admitting: Family Medicine

## 2022-05-10 ENCOUNTER — Emergency Department (HOSPITAL_COMMUNITY): Payer: Commercial Managed Care - PPO

## 2022-05-10 ENCOUNTER — Other Ambulatory Visit: Payer: Self-pay

## 2022-05-10 DIAGNOSIS — A419 Sepsis, unspecified organism: Secondary | ICD-10-CM | POA: Insufficient documentation

## 2022-05-10 DIAGNOSIS — F17290 Nicotine dependence, other tobacco product, uncomplicated: Secondary | ICD-10-CM | POA: Diagnosis present

## 2022-05-10 DIAGNOSIS — I10 Essential (primary) hypertension: Secondary | ICD-10-CM | POA: Insufficient documentation

## 2022-05-10 DIAGNOSIS — K219 Gastro-esophageal reflux disease without esophagitis: Secondary | ICD-10-CM | POA: Insufficient documentation

## 2022-05-10 DIAGNOSIS — Z79899 Other long term (current) drug therapy: Secondary | ICD-10-CM | POA: Insufficient documentation

## 2022-05-10 DIAGNOSIS — F17210 Nicotine dependence, cigarettes, uncomplicated: Secondary | ICD-10-CM | POA: Insufficient documentation

## 2022-05-10 DIAGNOSIS — K3580 Unspecified acute appendicitis: Principal | ICD-10-CM | POA: Insufficient documentation

## 2022-05-10 HISTORY — DX: Essential (primary) hypertension: I10

## 2022-05-10 LAB — CBC WITH DIFF
BASOPHIL #: <0.1 10*3/uL (ref <=0.20)
BASOPHIL %: 0 %
EOSINOPHIL #: <0.1 10*3/uL (ref <=0.50)
EOSINOPHIL %: 0 %
HCT: 44.9 % (ref 38.9–52.0)
HGB: 15.3 g/dL (ref 13.4–17.5)
IMMATURE GRANULOCYTE #: 0.1 10*3/uL — ABNORMAL HIGH (ref <0.10)
IMMATURE GRANULOCYTE %: 1 % (ref 0–1)
LYMPHOCYTE #: 1.88 10*3/uL (ref 1.00–4.80)
LYMPHOCYTE %: 11 %
MCH: 28.1 pg (ref 26.0–32.0)
MCHC: 34.1 g/dL (ref 31.0–35.5)
MCV: 82.5 fL (ref 78.0–100.0)
MONOCYTE #: 1.17 10*3/uL — ABNORMAL HIGH (ref 0.20–1.10)
MONOCYTE %: 7 %
MPV: 10 fL (ref 8.7–12.5)
NEUTROPHIL #: 13.31 10*3/uL — ABNORMAL HIGH (ref 1.50–7.70)
NEUTROPHIL %: 81 %
PLATELETS: 321 10*3/uL (ref 150–400)
RBC: 5.44 10*6/uL (ref 4.50–6.10)
RDW-CV: 12.7 % (ref 11.5–15.5)
WBC: 16.6 10*3/uL — ABNORMAL HIGH (ref 3.7–11.0)

## 2022-05-10 LAB — COMPREHENSIVE METABOLIC PANEL, NON-FASTING
ALBUMIN: 4.6 g/dL (ref 3.5–5.0)
ALKALINE PHOSPHATASE: 73 U/L (ref 45–115)
ALT (SGPT): 28 U/L (ref 10–55)
ANION GAP: 10 mmol/L (ref 4–13)
AST (SGOT): 15 U/L (ref 8–45)
BILIRUBIN TOTAL: 0.9 mg/dL (ref 0.3–1.3)
BUN/CREA RATIO: 14 (ref 6–22)
BUN: 11 mg/dL (ref 8–25)
CALCIUM: 9.9 mg/dL (ref 8.6–10.2)
CHLORIDE: 106 mmol/L (ref 96–111)
CO2 TOTAL: 24 mmol/L (ref 22–30)
CREATININE: 0.78 mg/dL (ref 0.75–1.35)
ESTIMATED GFR - MALE: >90 mL/min/BSA (ref >=60)
GLUCOSE: 147 mg/dL — ABNORMAL HIGH (ref 65–125)
POTASSIUM: 3.8 mmol/L (ref 3.5–5.1)
PROTEIN TOTAL: 7.7 g/dL (ref 6.4–8.3)
SODIUM: 140 mmol/L (ref 136–145)

## 2022-05-10 LAB — DRUG SCREEN, NO CONFIRMATION, URINE
AMPHETAMINES, URINE: NEGATIVE
BARBITURATES URINE: NEGATIVE
BENZODIAZEPINES URINE: NEGATIVE
BUPRENORPHINE URINE: NEGATIVE
CANNABINOIDS URINE: NEGATIVE
COCAINE METABOLITES URINE: NEGATIVE
CREATININE RANDOM URINE: 168 mg/dL — ABNORMAL HIGH (ref 50–100)
ECSTASY/MDMA URINE: NEGATIVE
FENTANYL, RANDOM URINE: NEGATIVE
METHADONE URINE: NEGATIVE
OPIATES URINE (LOW CUTOFF): POSITIVE — AB
OXYCODONE URINE: NEGATIVE

## 2022-05-10 LAB — LACTIC ACID LEVEL W/ REFLEX FOR LEVEL >2.0: LACTIC ACID: 1.3 mmol/L (ref 0.5–2.2)

## 2022-05-10 LAB — URINALYSIS, MICROSCOPIC

## 2022-05-10 LAB — BLUE TOP TUBE

## 2022-05-10 LAB — GOLD TOP TUBE

## 2022-05-10 LAB — URINALYSIS, MACROSCOPIC
BILIRUBIN: NEGATIVE mg/dL
BLOOD: NEGATIVE mg/dL
GLUCOSE: NEGATIVE mg/dL
KETONES: NEGATIVE mg/dL
LEUKOCYTES: NEGATIVE WBCs/uL
NITRITE: NEGATIVE
PH: 6.5 (ref 5.0–8.5)
PROTEIN: NEGATIVE mg/dL
SPECIFIC GRAVITY: 1.01 (ref 1.005–1.030)
UROBILINOGEN: 0.2 mg/dL

## 2022-05-10 LAB — LIPASE: LIPASE: 12 U/L (ref 10–60)

## 2022-05-10 MED ORDER — SODIUM CHLORIDE 0.9 % IV BOLUS
1000.0000 mL | INJECTION | Freq: Once | Status: AC
Start: 2022-05-10 — End: 2022-05-10
  Administered 2022-05-10: 1000 mL via INTRAVENOUS
  Administered 2022-05-10: 0 mL via INTRAVENOUS

## 2022-05-10 MED ORDER — IOPAMIDOL 300 MG IODINE/ML (61 %) INTRAVENOUS SOLUTION
100.0000 mL | INTRAVENOUS | Status: AC
Start: 2022-05-10 — End: 2022-05-10
  Administered 2022-05-10: 100 mL via INTRAVENOUS
  Filled 2022-05-10: qty 100

## 2022-05-10 MED ORDER — FENTANYL (PF) 50 MCG/ML INJECTION WRAPPER
50.0000 ug | INJECTION | INTRAMUSCULAR | Status: DC | PRN
Start: 2022-05-10 — End: 2022-05-12
  Administered 2022-05-10 – 2022-05-12 (×6): 50 ug via INTRAVENOUS
  Filled 2022-05-10 (×6): qty 1

## 2022-05-10 MED ORDER — LISINOPRIL 10 MG TABLET
10.0000 mg | ORAL_TABLET | Freq: Every day | ORAL | Status: DC
Start: 2022-05-10 — End: 2022-05-12
  Administered 2022-05-10 – 2022-05-12 (×2): 0 mg via ORAL

## 2022-05-10 MED ORDER — SODIUM CHLORIDE 0.9 % IV BOLUS
1000.0000 mL | INJECTION | Status: AC
Start: 2022-05-10 — End: 2022-05-10
  Administered 2022-05-10: 0 mL via INTRAVENOUS
  Administered 2022-05-10: 1000 mL via INTRAVENOUS

## 2022-05-10 MED ORDER — SODIUM CHLORIDE 0.9 % (FLUSH) INJECTION SYRINGE
10.0000 mL | INJECTION | Freq: Three times a day (TID) | INTRAMUSCULAR | Status: DC
Start: 2022-05-10 — End: 2022-05-12
  Administered 2022-05-10 – 2022-05-12 (×6): 0 mL

## 2022-05-10 MED ORDER — SODIUM CHLORIDE 0.9 % (FLUSH) INJECTION SYRINGE
10.0000 mL | INJECTION | INTRAMUSCULAR | Status: DC | PRN
Start: 2022-05-10 — End: 2022-05-12

## 2022-05-10 MED ORDER — SODIUM CHLORIDE 0.9 % INTRAVENOUS PIGGYBACK
3.3750 g | INJECTION | Freq: Three times a day (TID) | INTRAVENOUS | Status: DC
Start: 2022-05-10 — End: 2022-05-12
  Administered 2022-05-10: 3.375 g via INTRAVENOUS
  Administered 2022-05-10 – 2022-05-11 (×2): 0 g via INTRAVENOUS
  Administered 2022-05-11: 3.375 g via INTRAVENOUS
  Administered 2022-05-11: 0 g via INTRAVENOUS
  Administered 2022-05-11 (×2): 3.375 g via INTRAVENOUS
  Administered 2022-05-11 – 2022-05-12 (×3): 0 g via INTRAVENOUS
  Administered 2022-05-12: 3.375 g via INTRAVENOUS
  Filled 2022-05-10 (×5): qty 15

## 2022-05-10 MED ORDER — ONDANSETRON HCL (PF) 4 MG/2 ML INJECTION SOLUTION
4.0000 mg | Freq: Four times a day (QID) | INTRAMUSCULAR | Status: DC | PRN
Start: 2022-05-10 — End: 2022-05-12

## 2022-05-10 MED ORDER — SODIUM CHLORIDE 0.9 % INTRAVENOUS SOLUTION
INTRAVENOUS | Status: DC
Start: 2022-05-10 — End: 2022-05-12

## 2022-05-10 MED ORDER — SODIUM CHLORIDE 0.9 % INTRAVENOUS PIGGYBACK
3.3750 g | INJECTION | INTRAVENOUS | Status: AC
Start: 2022-05-10 — End: 2022-05-10
  Administered 2022-05-10: 3.375 g via INTRAVENOUS
  Administered 2022-05-10: 0 g via INTRAVENOUS
  Filled 2022-05-10: qty 15

## 2022-05-10 MED ORDER — MORPHINE 4 MG/ML INJECTION WRAPPER
4.0000 mg | INJECTION | INTRAMUSCULAR | Status: AC
Start: 2022-05-10 — End: 2022-05-10
  Administered 2022-05-10: 4 mg via INTRAVENOUS
  Filled 2022-05-10: qty 1

## 2022-05-10 MED ORDER — ONDANSETRON HCL (PF) 4 MG/2 ML INJECTION SOLUTION
4.0000 mg | INTRAMUSCULAR | Status: AC
Start: 2022-05-10 — End: 2022-05-10
  Administered 2022-05-10: 4 mg via INTRAVENOUS
  Filled 2022-05-10: qty 2

## 2022-05-10 MED ORDER — MAGNESIUM HYDROXIDE 400 MG/5 ML ORAL SUSPENSION
15.0000 mL | Freq: Every day | ORAL | Status: DC | PRN
Start: 2022-05-10 — End: 2022-05-12

## 2022-05-10 MED ORDER — PROCHLORPERAZINE EDISYLATE 10 MG/2 ML (5 MG/ML) INJECTION SOLUTION
10.0000 mg | Freq: Four times a day (QID) | INTRAMUSCULAR | Status: DC | PRN
Start: 2022-05-10 — End: 2022-05-12

## 2022-05-10 NOTE — Nurses Notes (Signed)
[place] Crews, PA made aware of patient HR running ST 120-130's, no new orders given at this time.

## 2022-05-10 NOTE — ED Nurses Note (Signed)
Pt report given to [facility] on the med surg unit at [facility].

## 2022-05-10 NOTE — Nurses Notes (Signed)
Patient arrived to room via wheelchair from ER, in stable condition. Patient alert and oriented x 4. Patient voiced c/o abdominal pain 10/10 on pain scale. Patient denies any nausea at this time. Will continue to monitor. Call light within reach. Dalia Heading, RN

## 2022-05-10 NOTE — H&P (Signed)
[name]  Hospitalist  History & Physical    Date of Service:  05/10/2022  Derrick Cisneros, Derrick Cisneros, 31 y.o. male  Date of Admission:  05/10/2022  Date of Birth:  1990/08/31  PCP: No Pcp    Chief Complaint:  Abdominal pain    HPI:    Derrick GOULETTE is a 31 y.o. White male who is admitted for acute appendicitis.  The patient presented to the emergency department with complaints of new onset lower abdominal pain that started this morning when he woke up.  Symptoms have been getting progressively worse.  The patient did have 1 episode diarrhea.  He denies any nausea vomiting.  Patient denies any fevers or chills.  In the emergency department the patient had a white count of 16.6.  Urine drug screen was positive for opiates.  Chemistry panel was unremarkable.  CT of the abdomen pelvis showed findings consistent with acute appendicitis.  Also there was a question of a nondistended testis on the right.  Patient was started on Zosyn in the emergency department.  Patient was admitted to the hospital for further treatment of his acute appendicitis.    Past Medical History:   Diagnosis Date    HTN (hypertension)       History reviewed. No pertinent surgical history.   Social History     Tobacco Use    Smoking status: Never    Smokeless tobacco: Never   Vaping Use    Vaping Use: Every day    Substances: Nicotine    Devices: Refillable tank   Substance Use Topics    Alcohol use: Never    Drug use: Never       Family Medical History:    None        Medications Prior to Admission       Prescriptions    lisinopriL (PRINIVIL) 10 mg Oral Tablet    Take 1 Tablet (10 mg total) by mouth Once a day           Allergies   Allergen Reactions    Naproxen Hives/ Urticaria    Sulfa (Sulfonamides) Hives/ Urticaria        Review of Systems   Constitutional:  Positive for malaise/fatigue. Negative for chills and fever.   HENT:  Negative for congestion, ear pain, sore throat and tinnitus.    Eyes:  Negative for blurred  vision, photophobia and pain.   Respiratory:  Negative for cough, hemoptysis, sputum production, shortness of breath and wheezing.    Cardiovascular:  Negative for chest pain, palpitations, orthopnea, claudication, leg swelling and PND.   Gastrointestinal:  Positive for abdominal pain and diarrhea. Negative for heartburn, nausea and vomiting.   Genitourinary:  Negative for dysuria, frequency and urgency.   Musculoskeletal:  Negative for back pain, joint pain and myalgias.   Skin:  Negative for rash.   Neurological:  Negative for dizziness, sensory change, speech change, focal weakness, weakness and headaches.   Endo/Heme/Allergies:  Negative for environmental allergies. Does not bruise/bleed easily.   Psychiatric/Behavioral:  Negative for depression and substance abuse. The patient is not nervous/anxious.           Filed Vitals:    05/10/22 1630 05/10/22 1651 05/10/22 1705 05/10/22 1711   BP: 138/89  (!) 142/82    Pulse:   (!) 120    Resp:   18    Temp:  36.5 C (97.7 F) 37.1 C (98.8 F)    SpO2: 94%  94%       Physical Exam  Vitals and nursing note reviewed.   Constitutional:       General: He is awake. He is not in acute distress.     Appearance: Normal appearance. He is well-developed and overweight. He is not ill-appearing.   HENT:      Head: Normocephalic and atraumatic.   Eyes:      Conjunctiva/sclera: Conjunctivae normal.      Pupils: Pupils are equal, round, and reactive to light.   Cardiovascular:      Rate and Rhythm: Normal rate and regular rhythm.      Heart sounds: Normal heart sounds. No murmur heard.     No friction rub. No gallop.   Pulmonary:      Effort: No respiratory distress.      Breath sounds: Normal breath sounds and air entry. No wheezing or rales.   Abdominal:      General: Bowel sounds are normal. There is no distension.      Palpations: Abdomen is soft. There is no mass.      Tenderness: There is abdominal tenderness in the right lower quadrant and left lower quadrant. There is no  guarding or rebound.   Musculoskeletal:         General: No deformity. Normal range of motion.      Cervical back: Normal range of motion and neck supple.   Skin:     General: Skin is warm and dry.      Findings: No erythema or rash.   Neurological:      Mental Status: He is alert and oriented to person, place, and time. Mental status is at baseline.      Cranial Nerves: No cranial nerve deficit.   Psychiatric:         Mood and Affect: Affect normal.         Behavior: Behavior is cooperative.         Cognition and Memory: Cognition and memory normal.         Laboratory Data:     Results for orders placed or performed during the hospital encounter of 05/10/22 (from the past 24 hour(s))   COMPREHENSIVE METABOLIC PANEL, NON-FASTING   Result Value Ref Range    SODIUM 140 136 - 145 mmol/L    POTASSIUM 3.8 3.5 - 5.1 mmol/L    CHLORIDE 106 96 - 111 mmol/L    CO2 TOTAL 24 22 - 30 mmol/L    ANION GAP 10 4 - 13 mmol/L    BUN 11 8 - 25 mg/dL    CREATININE 5.40 0.86 - 1.35 mg/dL    BUN/CREA RATIO 14 6 - 22    ALBUMIN 4.6 3.5 - 5.0 g/dL     CALCIUM 9.9 8.6 - 76.1 mg/dL    GLUCOSE 950 (H) 65 - 125 mg/dL    ALKALINE PHOSPHATASE 73 45 - 115 U/L    ALT (SGPT) 28 10 - 55 U/L    AST (SGOT)  15 8 - 45 U/L    BILIRUBIN TOTAL 0.9 0.3 - 1.3 mg/dL    PROTEIN TOTAL 7.7 6.4 - 8.3 g/dL    ESTIMATED GFR - MALE >90 >=60 mL/min/BSA   LACTIC ACID LEVEL W/ REFLEX FOR LEVEL >2.0   Result Value Ref Range    LACTIC ACID 1.3 0.5 - 2.2 mmol/L   LIPASE   Result Value Ref Range    LIPASE 12 10 - 60 U/L   CBC WITH DIFF  Result Value Ref Range    WBC 16.6 (H) 3.7 - 11.0 x10^3/uL    RBC 5.44 4.50 - 6.10 x10^6/uL    HGB 15.3 13.4 - 17.5 g/dL    HCT 62.9 47.6 - 54.6 %    MCV 82.5 78.0 - 100.0 fL    MCH 28.1 26.0 - 32.0 pg    MCHC 34.1 31.0 - 35.5 g/dL    RDW-CV 50.3 54.6 - 56.8 %    PLATELETS 321 150 - 400 x10^3/uL    MPV 10.0 8.7 - 12.5 fL    NEUTROPHIL % 81 %    LYMPHOCYTE % 11 %    MONOCYTE % 7 %    EOSINOPHIL % 0 %    BASOPHIL % 0 %    NEUTROPHIL # 13.31  (H) 1.50 - 7.70 x10^3/uL    LYMPHOCYTE # 1.88 1.00 - 4.80 x10^3/uL    MONOCYTE # 1.17 (H) 0.20 - 1.10 x10^3/uL    EOSINOPHIL # <0.10 <=0.50 x10^3/uL    BASOPHIL # <0.10 <=0.20 x10^3/uL    IMMATURE GRANULOCYTE % 1 0 - 1 %    IMMATURE GRANULOCYTE # 0.10 (H) <0.10 x10^3/uL   BLUE TOP TUBE   Result Value Ref Range    RAINBOW/EXTRA TUBE AUTO RESULT Yes    DRUG SCREEN, NO CONFIRMATION, URINE   Result Value Ref Range    AMPHETAMINES, URINE Negative Negative    BARBITURATES URINE Negative Negative    BENZODIAZEPINES URINE Negative Negative    BUPRENORPHINE URINE Negative Negative    CANNABINOIDS URINE Negative Negative    COCAINE METABOLITES URINE Negative Negative    METHADONE URINE Negative Negative    OPIATES URINE (LOW CUTOFF) Positive (A) Negative    OXYCODONE URINE Negative Negative    ECSTASY/MDMA URINE Negative Negative    FENTANYL, RANDOM URINE Negative Negative    CREATININE RANDOM URINE 168 (H) 50 - 100 mg/dL   URINALYSIS, MACROSCOPIC   Result Value Ref Range    COLOR Yellow Yellow, Straw    APPEARANCE Clear Clear, Slightly Hazy    SPECIFIC GRAVITY 1.010 1.005 - 1.030    PH 6.5 5.0 - 8.5    LEUKOCYTES Negative Negative WBCs/uL    NITRITE Negative Negative    PROTEIN Negative Negative mg/dL    GLUCOSE Negative Negative mg/dL    KETONES Negative Negative mg/dL    UROBILINOGEN 0.2 0.2 mg/dL    BILIRUBIN Negative Negative mg/dL    BLOOD Negative Negative mg/dL   URINALYSIS, MICROSCOPIC   Result Value Ref Range    RBCS None None, Occasional, 0-2, 3-5 /hpf    WBCS 0-2 None, Occasional, 0-2, 3-5 /hpf    BACTERIA None None /hpf    SQUAMOUS EPITHELIAL Occasional None, Occasional, 0-2, 3-5 /hpf    MUCOUS Occasional (A) None /hpf       Imaging Studies:    CT ABDOMEN PELVIS W IV CONTRAST   Final Result by Edi, Radresults In (10/04 1602)   1. Imaging findings consistent with acute appendicitis.   2. Asymmetric prominence of the right inguinal canal, cannot exclude nondistended testis. Recommend nonemergent testicular  ultrasound.      Critical findings were discussed via secure chat with ordering provider Dr. Randel Pigg of [facility] emergency department at 4:00 PM by Dr. Mattie Marlin. Receipt was confirmed.                        The CT exam was performed using one or more of  the following dose reduction techniques: Automated exposure control, adjustment of the mA and/or kV according to the patient's size, or use of iterative reconstruction technique.         Radiologist location ID: [id number]             Assessment/Plan:  Active Hospital Problems    Diagnosis    Primary Problem: Acute appendicitis    Hypertension       Acute appendicitis.  Will admit the patient to med surge.  The patient will be started on Zosyn 3.375 IV q.6 hours.  Normal saline at 125 mL/hour.  Patient will be made NPO.  Fentanyl for pain and Zofran for nausea.  Will consult surgery service.  Repeat lab work in the morning.    Hypertension.  Holding p.o. medications.    DVT prophylaxis.  Ted's and SCDs.    Bethanie Dicker, MD

## 2022-05-10 NOTE — Care Plan (Signed)
Problem: Adult Inpatient Plan of Care  Goal: Plan of Care Review  Outcome: Ongoing (see interventions/notes)  Goal: Patient-Specific Goal (Individualized)  Outcome: Ongoing (see interventions/notes)  Flowsheets (Taken 05/10/2022 1711)  Individualized Care Needs: abdominal pain / IV antibiotics  Anxieties, Fears or Concerns: abdominal pain  Patient-Specific Goals (Include Timeframe): to get better and go home  Goal: Absence of Hospital-Acquired Illness or Injury  Outcome: Ongoing (see interventions/notes)  Intervention: Identify and Manage Fall Risk  Recent Flowsheet Documentation  Taken 05/10/2022 1711 by Dalia Heading, RN  Safety Promotion/Fall Prevention:   fall prevention program maintained   nonskid shoes/slippers when out of bed   safety round/check completed  Intervention: Prevent Skin Injury  Recent Flowsheet Documentation  Taken 05/10/2022 1711 by Dalia Heading, RN  Skin Protection:   adhesive use limited   incontinence pads utilized   tubing/devices free from skin contact  Intervention: Prevent and Manage VTE (Venous Thromboembolism) Risk  Recent Flowsheet Documentation  Taken 05/10/2022 1711 by Dalia Heading, RN  VTE Prevention/Management:   ambulation promoted   anticoagulant therapy maintained  Goal: Optimal Comfort and Wellbeing  Outcome: Ongoing (see interventions/notes)  Intervention: Provide Person-Centered Care  Recent Flowsheet Documentation  Taken 05/10/2022 1711 by Dalia Heading, RN  Trust Relationship/Rapport:   care explained   questions answered   questions encouraged  Goal: Rounds/Family Conference  Outcome: Ongoing (see interventions/notes)     Problem: Health Knowledge, Opportunity to Enhance (Adult,Obstetrics,Pediatric)  Goal: Knowledgeable about Health Subject/Topic  Description: Patient will demonstrate the desired outcomes by discharge/transition of care.  Outcome: Ongoing (see interventions/notes)     Problem: Appendectomy  Goal: Absence of Bleeding  Outcome: Ongoing (see  interventions/notes)  Goal: Effective Bowel Elimination  Outcome: Ongoing (see interventions/notes)  Goal: Fluid and Electrolyte Balance  Outcome: Ongoing (see interventions/notes)  Goal: Absence of Infection Signs and Symptoms  Outcome: Ongoing (see interventions/notes)  Goal: Anesthesia/Sedation Recovery  Outcome: Ongoing (see interventions/notes)  Intervention: Optimize Anesthesia Recovery  Recent Flowsheet Documentation  Taken 05/10/2022 1711 by Dalia Heading, RN  Safety Promotion/Fall Prevention:   fall prevention program maintained   nonskid shoes/slippers when out of bed   safety round/check completed  Goal: Acceptable Pain Control  Outcome: Ongoing (see interventions/notes)  Intervention: Prevent or Manage Pain  Recent Flowsheet Documentation  Taken 05/10/2022 1711 by Dalia Heading, RN  Diversional Activities: television  Goal: Nausea and Vomiting Relief  Outcome: Ongoing (see interventions/notes)  Goal: Effective Urinary Elimination  Outcome: Ongoing (see interventions/notes)  Goal: Effective Oxygenation and Ventilation  Outcome: Ongoing (see interventions/notes)     Problem: Pain Acute  Goal: Optimal Pain Control and Function  Outcome: Ongoing (see interventions/notes)  Intervention: Optimize Psychosocial Wellbeing  Recent Flowsheet Documentation  Taken 05/10/2022 1711 by Dalia Heading, RN  Diversional Activities: television     Problem: Hypertension Comorbidity  Goal: Blood Pressure in Desired Range  Outcome: Ongoing (see interventions/notes)

## 2022-05-10 NOTE — Nurses Notes (Signed)
Fentanyl 50 mcg given IVPB for patient c/o right sided pain, patient rates pain 10/10 on pain scale.

## 2022-05-10 NOTE — ED Provider Notes (Signed)
Emergency Department  [facility]   05/10/2022     Derrick Cisneros  08/04/1991  31 y.o.  male  [place] [address]   [phone] (home)  PCP: No Pcp   Date of service:05/10/2022 14:52    Chief Complaint:   Chief Complaint   Patient presents with    Abdominal Pain    Shortness of Breath           HPI: This is a 31 y.o. male who presents to the emergency department with onset this a.m. of left lower abdominal pain.  Symptoms have gotten progressively worse.  He did have 1 episode of diarrhea but no hematochezia.  No nausea or vomiting.  He had previously experienced this once in the past he said for meeting too much spicy food.  He does take medication for acid reflux.  No history of abdominal surgeries.  Complains of some urinary hesitancy.  No history of kidney stones.  Denies any flank pain.  Also denies any history of diverticulitis in the past.                Past Medical History:   Past Medical History:   Diagnosis Date    HTN (hypertension)      (Not in an outpatient encounter)     Past Surgical History: History reviewed. No pertinent surgical history.    Social History:   Social History     Tobacco Use    Smoking status: Never    Smokeless tobacco: Never   Vaping Use    Vaping Use: Every day    Substances: Nicotine    Devices: Refillable tank   Substance Use Topics    Alcohol use: Never    Drug use: Never        Family History:  No family history on file.  Prior to Admission medications    Medication Sig Start Date End Date Taking? Authorizing Provider   lisinopriL (PRINIVIL) 10 mg Oral Tablet Take 1 Tablet (10 mg total) by mouth Once a day   Yes Provider, Historical          Allergies:   Allergies   Allergen Reactions    Naproxen Hives/ Urticaria    Sulfa (Sulfonamides) Hives/ Urticaria       Above history reviewed with patient.  Allergies, medication list, reviewed.          Physical Exam  Body mass index is 32.11 kg/m.  ED Triage Vitals [05/10/22 1437]   BP (Non-Invasive) (!)  140/104   Heart Rate (!) 118   Respiratory Rate 18   Temperature 36.3 C (97.3 F)   SpO2 97 %   Weight 93 kg (205 lb)   Height 1.702 m (5\' 7" )     Constitutional: patient is oriented to person, place, and time and well-developed, well-nourished, and in mild distress.  Somewhat disheveled  HENT:   Head: Normocephalic and atraumatic.   Right Ear: External ear normal.   Left Ear: External ear normal.   Nose: Nose normal.   Mouth/Throat: Oropharynx is clear and moist.   Eyes: Pupils are equal, round, and reactive to light. Conjunctivae and EOM are normal.   Neck: Normal range of motion. Neck supple.   Cardiovascular:  Tachycardic, regular rhythm, normal heart sounds and intact distal pulses.   Pulmonary/Chest: Effort normal and breath sounds normal.   Abdominal: Soft.  Mild diffuse tenderness.  Minimal tenderness in the epigastrium.  He is tender in both lower quadrants with  voluntary guarding but no rebound.  Musculoskeletal: Normal range of motion.   Neurological:Patient is alert and oriented to person, place, and time.  GCS score is 15.   Skin: Skin is warm and dry.   Psychiatric: Mood, memory, affect and judgment normal.    The following orders were placed after examining the patient :  Orders Placed This Encounter    CT ABDOMEN PELVIS W IV CONTRAST    CBC/DIFF    COMPREHENSIVE METABOLIC PANEL, NON-FASTING    LACTIC ACID LEVEL W/ REFLEX FOR LEVEL >2.0    LIPASE    URINALYSIS, MACROSCOPIC AND MICROSCOPIC    DRUG SCREEN, NO CONFIRMATION, URINE    CBC WITH DIFF    URINALYSIS, MACROSCOPIC    URINALYSIS, MICROSCOPIC    EXTRA TUBES    BLUE TOP TUBE    GOLD TOP TUBE    INSERT & MAINTAIN PERIPHERAL IV ACCESS    NS bolus infusion 1,000 mL    morphine 4 mg/mL injection    ondansetron (ZOFRAN) 2 mg/mL injection    iopamidol (ISOVUE-300) 61% infusion    piperacillin-tazobactam (ZOSYN) 3.375 g in NS 100 mL IVPB minibag      CT ABDOMEN PELVIS W IV CONTRAST   Final Result   1. Imaging findings consistent with acute appendicitis.    2. Asymmetric prominence of the right inguinal canal, cannot exclude nondistended testis. Recommend nonemergent testicular ultrasound.      Critical findings were discussed via secure chat with ordering provider Dr. Jennet Maduro of [facility] emergency department at 4:00 PM by Dr. Marybelle Killings. Receipt was confirmed.                        The CT exam was performed using one or more of the following dose reduction techniques: Automated exposure control, adjustment of the mA and/or kV according to the patient's size, or use of iterative reconstruction technique.         Radiologist location ID: [id number]            Results for orders placed or performed during the hospital encounter of 05/10/22 (from the past 12 hour(s))   COMPREHENSIVE METABOLIC PANEL, NON-FASTING   Result Value Ref Range    SODIUM 140 136 - 145 mmol/L    POTASSIUM 3.8 3.5 - 5.1 mmol/L    CHLORIDE 106 96 - 111 mmol/L    CO2 TOTAL 24 22 - 30 mmol/L    ANION GAP 10 4 - 13 mmol/L    BUN 11 8 - 25 mg/dL    CREATININE 0.78 0.75 - 1.35 mg/dL    BUN/CREA RATIO 14 6 - 22    ALBUMIN 4.6 3.5 - 5.0 g/dL     CALCIUM 9.9 8.6 - 10.2 mg/dL    GLUCOSE 147 (H) 65 - 125 mg/dL    ALKALINE PHOSPHATASE 73 45 - 115 U/L    ALT (SGPT) 28 10 - 55 U/L    AST (SGOT)  15 8 - 45 U/L    BILIRUBIN TOTAL 0.9 0.3 - 1.3 mg/dL    PROTEIN TOTAL 7.7 6.4 - 8.3 g/dL    ESTIMATED GFR - MALE >90 >=60 mL/min/BSA   LACTIC ACID LEVEL W/ REFLEX FOR LEVEL >2.0   Result Value Ref Range    LACTIC ACID 1.3 0.5 - 2.2 mmol/L   LIPASE   Result Value Ref Range    LIPASE 12 10 - 60 U/L   CBC WITH DIFF   Result Value  Ref Range    WBC 16.6 (H) 3.7 - 11.0 x10^3/uL    RBC 5.44 4.50 - 6.10 x10^6/uL    HGB 15.3 13.4 - 17.5 g/dL    HCT 34.7 42.5 - 95.6 %    MCV 82.5 78.0 - 100.0 fL    MCH 28.1 26.0 - 32.0 pg    MCHC 34.1 31.0 - 35.5 g/dL    RDW-CV 38.7 56.4 - 33.2 %    PLATELETS 321 150 - 400 x10^3/uL    MPV 10.0 8.7 - 12.5 fL    NEUTROPHIL % 81 %    LYMPHOCYTE % 11 %    MONOCYTE % 7 %    EOSINOPHIL  % 0 %    BASOPHIL % 0 %    NEUTROPHIL # 13.31 (H) 1.50 - 7.70 x10^3/uL    LYMPHOCYTE # 1.88 1.00 - 4.80 x10^3/uL    MONOCYTE # 1.17 (H) 0.20 - 1.10 x10^3/uL    EOSINOPHIL # <0.10 <=0.50 x10^3/uL    BASOPHIL # <0.10 <=0.20 x10^3/uL    IMMATURE GRANULOCYTE % 1 0 - 1 %    IMMATURE GRANULOCYTE # 0.10 (H) <0.10 x10^3/uL         ED Course:   ED Course as of 05/10/22 1616   Wed May 10, 2022   1512 CBC/DIFF(!)  Leukocytosis   1523 LACTIC ACID LEVEL W/ REFLEX FOR LEVEL >2.0  Normal   1528 LIPASE  Normal   1529 COMPREHENSIVE METABOLIC PANEL, NON-FASTING(!)  Unremarkable      Medications Administered in the ED   piperacillin-tazobactam (ZOSYN) 3.375 g in NS 100 mL IVPB minibag (has no administration in time range)   NS bolus infusion 1,000 mL (1,000 mL Intravenous New Bag/New Syringe 05/10/22 1506)   morphine 4 mg/mL injection (4 mg Intravenous Given 05/10/22 1506)   ondansetron (ZOFRAN) 2 mg/mL injection (4 mg Intravenous Given 05/10/22 1505)   iopamidol (ISOVUE-300) 61% infusion (100 mL Intravenous Given 05/10/22 1543)        Emergency Department Procedure:      Procedures    Medical Decision Making  Differential diagnosis includes UTI, urinary retention, diverticulitis, appendicitis, constipation, colitis, perforated viscus.  Labs, pain control, CT scan ordered.    Problems Addressed:  Acute appendicitis, unspecified acute appendicitis type: acute illness or injury     Details: Patient will be admitted on IV antibiotics with surgical consultation    Amount and/or Complexity of Data Reviewed  Labs: ordered. Decision-making details documented in ED Course.  Radiology: ordered. Decision-making details documented in ED Course.    Risk  Prescription drug management.  Parenteral controlled substances.  Decision regarding hospitalization.            Findings and diagnosis discussed with patient.  Clinical Impression:   Clinical Impression   Acute appendicitis, unspecified acute appendicitis type (Primary)               Disposition:  Admitted          No follow-ups on file.   New Prescriptions    No medications on file      No follow-up provider specified.   BP (!) 140/104   Pulse (!) 118   Temp 36.3 C (97.3 F)   Resp 18   Ht 1.702 m (5\' 7" )   Wt 93 kg (205 lb)   SpO2 97%   BMI 32.11 kg/m        , MD10/11/2021  This note was partially created using voice recognition software and is inherently subject to errors including those of syntax and "sound alike " substitutions which may escape proof reading.  In such instances, original meaning may be extrapolated by contextual derivation.

## 2022-05-10 NOTE — Nurses Notes (Signed)
Normal Saline 1 liter bolus started at this time.

## 2022-05-10 NOTE — ED Triage Notes (Signed)
Pt arrived per [facility] unit 80. Vickki Hearing was called for abdominal pain at [facility]. Pt states that the abdominal pain started at 0800 on 05/10/22. Pt states that the pain is in the lower left abdomen quadrant. Pt rates the 10/10.     Last had a drink and food at 1300.   Blood glucose was 130 in route to hospital.

## 2022-05-11 ENCOUNTER — Encounter (HOSPITAL_COMMUNITY)
Admission: EM | Disposition: A | Discharge status: Home / Self Care | Payer: Self-pay | Source: Home / Self Care | Attending: Family Medicine

## 2022-05-11 ENCOUNTER — Encounter (HOSPITAL_COMMUNITY): Payer: Self-pay | Admitting: Family Medicine

## 2022-05-11 ENCOUNTER — Inpatient Hospital Stay (HOSPITAL_COMMUNITY): Payer: Commercial Managed Care - PPO | Admitting: Certified Registered"

## 2022-05-11 DIAGNOSIS — A419 Sepsis, unspecified organism: Secondary | ICD-10-CM | POA: Insufficient documentation

## 2022-05-11 LAB — CBC WITH DIFF
BASOPHIL #: <0.1 10*3/uL (ref <=0.20)
BASOPHIL %: 0 %
EOSINOPHIL #: <0.1 10*3/uL (ref <=0.50)
EOSINOPHIL %: 0 %
HCT: 36.4 % — ABNORMAL LOW (ref 38.9–52.0)
HGB: 12.6 g/dL — ABNORMAL LOW (ref 13.4–17.5)
IMMATURE GRANULOCYTE #: <0.1 10*3/uL (ref <0.10)
IMMATURE GRANULOCYTE %: 1 % (ref 0–1)
LYMPHOCYTE #: 1.94 10*3/uL (ref 1.00–4.80)
LYMPHOCYTE %: 11 %
MCH: 28.7 pg (ref 26.0–32.0)
MCHC: 34.6 g/dL (ref 31.0–35.5)
MCV: 82.9 fL (ref 78.0–100.0)
MONOCYTE #: 1.28 10*3/uL — ABNORMAL HIGH (ref 0.20–1.10)
MONOCYTE %: 7 %
MPV: 10.3 fL (ref 8.7–12.5)
NEUTROPHIL #: 14.33 10*3/uL — ABNORMAL HIGH (ref 1.50–7.70)
NEUTROPHIL %: 81 %
PLATELETS: 251 10*3/uL (ref 150–400)
RBC: 4.39 10*6/uL — ABNORMAL LOW (ref 4.50–6.10)
RDW-CV: 12.9 % (ref 11.5–15.5)
WBC: 17.7 10*3/uL — ABNORMAL HIGH (ref 3.7–11.0)

## 2022-05-11 LAB — COMPREHENSIVE METABOLIC PANEL, NON-FASTING
ALBUMIN: 3.3 g/dL — ABNORMAL LOW (ref 3.5–5.0)
ALKALINE PHOSPHATASE: 50 U/L (ref 45–115)
ALT (SGPT): 20 U/L (ref 10–55)
ANION GAP: 8 mmol/L (ref 4–13)
AST (SGOT): 13 U/L (ref 8–45)
BILIRUBIN TOTAL: 1.6 mg/dL — ABNORMAL HIGH (ref 0.3–1.3)
BUN/CREA RATIO: 18 (ref 6–22)
BUN: 15 mg/dL (ref 8–25)
CALCIUM: 8.6 mg/dL (ref 8.6–10.2)
CHLORIDE: 109 mmol/L (ref 96–111)
CO2 TOTAL: 23 mmol/L (ref 22–30)
CREATININE: 0.84 mg/dL (ref 0.75–1.35)
ESTIMATED GFR - MALE: >90 mL/min/BSA (ref >=60)
GLUCOSE: 128 mg/dL — ABNORMAL HIGH (ref 65–125)
POTASSIUM: 3.7 mmol/L (ref 3.5–5.1)
PROTEIN TOTAL: 5.9 g/dL — ABNORMAL LOW (ref 6.4–8.3)
SODIUM: 140 mmol/L (ref 136–145)

## 2022-05-11 LAB — MAGNESIUM: MAGNESIUM: 1.6 mg/dL — ABNORMAL LOW (ref 1.8–2.6)

## 2022-05-11 SURGERY — APPENDECTOMY LAPAROSCOPIC
Anesthesia: General | Wound class: Clean Wound: Uninfected operative wounds in which no inflammation occurred

## 2022-05-11 MED ORDER — DEXAMETHASONE SODIUM PHOSPHATE (PF) 10 MG/ML INJECTION SOLUTION
INTRAMUSCULAR | Status: AC
Start: 2022-05-11 — End: 2022-05-11
  Filled 2022-05-11: qty 1

## 2022-05-11 MED ORDER — PROPOFOL 10 MG/ML INTRAVENOUS EMULSION
INTRAVENOUS | Status: AC
Start: 2022-05-11 — End: 2022-05-11
  Filled 2022-05-11: qty 20

## 2022-05-11 MED ORDER — SUCCINYLCHOLINE 20 MG/ML INTRAVENOUS WRAPPER
INJECTION | INTRAVENOUS | Status: AC
Start: 2022-05-11 — End: 2022-05-11
  Filled 2022-05-11: qty 10

## 2022-05-11 MED ORDER — FENTANYL (PF) 50 MCG/ML INJECTION SOLUTION
Freq: Once | INTRAMUSCULAR | Status: DC | PRN
Start: 2022-05-11 — End: 2022-05-11
  Administered 2022-05-11 (×4): 50 ug via INTRAVENOUS

## 2022-05-11 MED ORDER — KETOROLAC 30 MG/ML (1 ML) INJECTION SOLUTION
INTRAMUSCULAR | Status: AC
Start: 2022-05-11 — End: 2022-05-11
  Filled 2022-05-11: qty 1

## 2022-05-11 MED ORDER — ACETAMINOPHEN 1,000 MG/100 ML (10 MG/ML) INTRAVENOUS SOLUTION
Freq: Once | INTRAVENOUS | Status: DC | PRN
Start: 2022-05-11 — End: 2022-05-11
  Administered 2022-05-11: 1000 mg via INTRAVENOUS

## 2022-05-11 MED ORDER — ONDANSETRON HCL (PF) 4 MG/2 ML INJECTION SOLUTION
INTRAMUSCULAR | Status: AC
Start: 2022-05-11 — End: 2022-05-11
  Filled 2022-05-11: qty 2

## 2022-05-11 MED ORDER — SUCCINYLCHOLINE 20 MG/ML INTRAVENOUS WRAPPER
INJECTION | Freq: Once | INTRAVENOUS | Status: DC | PRN
Start: 2022-05-11 — End: 2022-05-11
  Administered 2022-05-11: 160 mg via INTRAVENOUS

## 2022-05-11 MED ORDER — SUGAMMADEX 100 MG/ML INTRAVENOUS SOLUTION
Freq: Once | INTRAVENOUS | Status: DC | PRN
Start: 2022-05-11 — End: 2022-05-11
  Administered 2022-05-11: 400 mg via INTRAVENOUS

## 2022-05-11 MED ORDER — ESMOLOL 100 MG/10 ML (10 MG/ML) INTRAVENOUS SOLUTION
Freq: Once | INTRAVENOUS | Status: DC | PRN
Start: 2022-05-11 — End: 2022-05-11
  Administered 2022-05-11: 20 mg via INTRAVENOUS

## 2022-05-11 MED ORDER — ESMOLOL 100 MG/10 ML (10 MG/ML) INTRAVENOUS SOLUTION
INTRAVENOUS | Status: AC
Start: 2022-05-11 — End: 2022-05-11
  Filled 2022-05-11: qty 10

## 2022-05-11 MED ORDER — DIPHENHYDRAMINE 50 MG/ML INJECTION SOLUTION
Freq: Once | INTRAMUSCULAR | Status: DC | PRN
Start: 2022-05-11 — End: 2022-05-11
  Administered 2022-05-11: 50 mg via INTRAVENOUS

## 2022-05-11 MED ORDER — DEXAMETHASONE SODIUM PHOSPHATE (PF) 10 MG/ML INJECTION SOLUTION
Freq: Once | INTRAMUSCULAR | Status: DC | PRN
Start: 2022-05-11 — End: 2022-05-11
  Administered 2022-05-11: 10 mg via INTRAVENOUS

## 2022-05-11 MED ORDER — ROCURONIUM 10 MG/ML INTRAVENOUS SOLUTION
Freq: Once | INTRAVENOUS | Status: DC | PRN
Start: 2022-05-11 — End: 2022-05-11
  Administered 2022-05-11: 45 mg via INTRAVENOUS
  Administered 2022-05-11: 10 mg via INTRAVENOUS
  Administered 2022-05-11: 5 mg via INTRAVENOUS

## 2022-05-11 MED ORDER — ALBUTEROL SULFATE HFA 90 MCG/ACTUATION AEROSOL INHALER
INHALATION_SPRAY | RESPIRATORY_TRACT | Status: AC
Start: 2022-05-11 — End: 2022-05-11
  Filled 2022-05-11: qty 6.7

## 2022-05-11 MED ORDER — ONDANSETRON HCL (PF) 4 MG/2 ML INJECTION SOLUTION
Freq: Once | INTRAMUSCULAR | Status: DC | PRN
Start: 2022-05-11 — End: 2022-05-11
  Administered 2022-05-11: 4 mg via INTRAVENOUS

## 2022-05-11 MED ORDER — MIDAZOLAM 1 MG/ML INJECTION WRAPPER
Freq: Once | INTRAMUSCULAR | Status: DC | PRN
Start: 2022-05-11 — End: 2022-05-11
  Administered 2022-05-11: 2 mg via INTRAVENOUS

## 2022-05-11 MED ORDER — FENTANYL (PF) 50 MCG/ML INJECTION SOLUTION
INTRAMUSCULAR | Status: AC
Start: 2022-05-11 — End: 2022-05-11
  Filled 2022-05-11: qty 2

## 2022-05-11 MED ORDER — SUGAMMADEX 100 MG/ML INTRAVENOUS SOLUTION
INTRAVENOUS | Status: AC
Start: 2022-05-11 — End: 2022-05-11
  Filled 2022-05-11: qty 4

## 2022-05-11 MED ORDER — LIDOCAINE (PF) 20 MG/ML (2 %) INJECTION SOLUTION
Freq: Once | INTRAMUSCULAR | Status: DC | PRN
Start: 2022-05-11 — End: 2022-05-11
  Administered 2022-05-11: 100 mg via INTRAVENOUS

## 2022-05-11 MED ORDER — LIDOCAINE (PF) 20 MG/ML (2 %) INJECTION SOLUTION
INTRAMUSCULAR | Status: AC
Start: 2022-05-11 — End: 2022-05-11
  Filled 2022-05-11: qty 5

## 2022-05-11 MED ORDER — DIPHENHYDRAMINE 50 MG/ML INJECTION SOLUTION
INTRAMUSCULAR | Status: AC
Start: 2022-05-11 — End: 2022-05-11
  Filled 2022-05-11: qty 1

## 2022-05-11 MED ORDER — SODIUM CHLORIDE 0.9 % IV BOLUS
1000.0000 mL | INJECTION | Freq: Once | Status: AC
Start: 2022-05-11 — End: 2022-05-11
  Administered 2022-05-11: 0 mL via INTRAVENOUS
  Administered 2022-05-11: 1000 mL via INTRAVENOUS

## 2022-05-11 MED ORDER — MIDAZOLAM 1 MG/ML INJECTION WRAPPER
INTRAMUSCULAR | Status: AC
Start: 2022-05-11 — End: 2022-05-11
  Filled 2022-05-11: qty 2

## 2022-05-11 MED ORDER — SODIUM CHLORIDE 0.9 % INTRAVENOUS SOLUTION
INTRAVENOUS | Status: DC | PRN
Start: 2022-05-11 — End: 2022-05-11
  Administered 2022-05-11: 0 via INTRAVENOUS

## 2022-05-11 MED ORDER — PROPOFOL 10 MG/ML IV BOLUS
INJECTION | Freq: Once | INTRAVENOUS | Status: DC | PRN
Start: 2022-05-11 — End: 2022-05-11
  Administered 2022-05-11: 50 mg via INTRAVENOUS
  Administered 2022-05-11: 150 mg via INTRAVENOUS

## 2022-05-11 MED ORDER — ACETAMINOPHEN 1,000 MG/100 ML (10 MG/ML) INTRAVENOUS SOLUTION
INTRAVENOUS | Status: AC
Start: 2022-05-11 — End: 2022-05-11
  Filled 2022-05-11: qty 100

## 2022-05-11 MED ORDER — ALBUTEROL SULFATE HFA 90 MCG/ACTUATION AEROSOL INHALER
INHALATION_SPRAY | Freq: Once | RESPIRATORY_TRACT | Status: DC | PRN
Start: 2022-05-11 — End: 2022-05-11
  Administered 2022-05-11: 3 via RESPIRATORY_TRACT

## 2022-05-11 SURGICAL SUPPLY — 82 items
APPL SKNCLS .6ML STRSTRP BENZOIN TNCT NONHYPOALLERGENIC STRL LF (SUTURE/WOUND CLOSURE) ×1 IMPLANT
APPL SKNCLS .6ML STRSTRP BENZO_IN TNCT NONHYPOALLERGENIC STRL (SUTURE/WOUND CLOSURE) ×1
BAG SP RTRVL 6X4IN 10MM EPCH L_RG PLASTIC INTROD TUBE FLXB (INSTRUMENTS ENDOMECHANICAL) ×1
BAG SPEC RETR 6X4IN 10MM EPCH LRG PLASTIC 224ML STRL LF  DISP (ENDOSCOPIC SUPPLIES) ×1 IMPLANT
BLADE SURG CLPR W 37.2MM GP EXIST HNDL GTT IN CHRG .23MM NONST LF  DISP (MED SURG SUPPLIES) IMPLANT
BLADE SURG CLPR W 37.2MM GP EX_IST HNDL GTT IN CHRG .23MM (MED SURG SUPPLIES)
CAN SUCT 2000ML 90D LOCK LID OVFLW SHTOF VALVE PR SPOUT FILTER MEDIVAC GRDN DISP LF (MED SURG SUPPLIES) ×1 IMPLANT
CAN SUCT 2000ML 90D LOCK LID O_VFLW SHTOF VALVE PR SPOUT FLTR (MED SURG SUPPLIES) ×1
CANNULA LAPSCP 5MM 100MM VERSAONE STD UNIV FIX BLDLS DLPHN NOSE TIP LOW PROF STRL LF  DISP (ENDOSCOPIC SUPPLIES) ×1 IMPLANT
CATH URETH DOVER 16FR FOLEY 2W LRG SMOOTH DRAIN EYE FIRM TIP SIL 10ML STRL LF  BLU STRP CLR (UROLOGICAL SUPPLIES) IMPLANT
CATH URETH DOVER 16FR FOLEY 2W_LRG SMOOTH DRAIN EYE FIRM TIP (UROLOGICAL SUPPLIES)
CLOSURE STERI STRIP 1/2X2IN_R1549 50X6/BX (WOUND CARE SUPPLY) ×1 IMPLANT
CONV USE ITEM 321846 - GLOVE SURG 7.5 LF PF BEAD CUF_STRL 12IN PROTEXIS PI PLISPRN (GLOVES AND ACCESSORIES) ×1 IMPLANT
CONV USE ITEM 321863 - GLOVE SURG 6.5 LF PF SMTH BE_AD CUF STRL NTR 12IN PROTEXIS (GLOVES AND ACCESSORIES) ×2 IMPLANT
CONV USE ITEM 337894 - PACK SURG CUSTOM GENERAL LF_HAZARDOUS (CUSTOM TRAYS & PACK) ×1 IMPLANT
CONV USE ITEM 339055 - GOWN SURG 3XL STD LGTH L4 HKLP CLSR RGLN SLEEVE TWL STRL LF  DISP WHT AERO CR PRFRM FBRC (DRAPE/PACKS/SHEETS/OR TOWEL) IMPLANT
DEVICE ENDOS ENDO PNUT 5MM 45CM SWAB COTTON DISP (ENDOSCOPIC SUPPLIES)
DEVICE ENDOS ENDO PNUT 5MM 45C_M SWAB COTTON DISP (INSTRUMENTS ENDOMECHANICAL)
DEVICE SPEC RETR INZII 5MM GUIDE BEAD UNIV ENDOS 180ML LF (ENDOSCOPIC SUPPLIES) ×1 IMPLANT
DEVICE SPEC RETR INZII 5MM GUI_DE BEAD UNIV ENDOS 180ML LF (INSTRUMENTS ENDOMECHANICAL) ×1
DISCONTINUED USE 317055 - DSCT LAPSCP CURVE JAW US CRDLS 39CM SONICISION (ENDOSCOPIC SUPPLIES) ×1 IMPLANT
DISCONTINUED USE ITEM 154958 - TUBING INSFL INSUF HIFLO LL FILTER STRL DISP LF (ENDOSCOPIC SUPPLIES) ×1 IMPLANT
DISSECTOR LAPSCP 39CM CVD JAW_CORDLESS (INSTRUMENTS ENDOMECHANICAL) ×1
DRAPE ENDOSCP ABS IMPRV REINF ARMBRD CVR CORD HOLD TAB 38.5X16IN 11IN 10IN CNVRT LF  STRL SURG (DRAPE/PACKS/SHEETS/OR TOWEL) ×1 IMPLANT
DRAPE ENDOSCP ABS IMPRV REINF_ARMBRD CVR CORD HOLD TAB 38.5 (DRAPE/PACKS/SHEETS/OR TOWEL) ×1
DRESS TRNSPR 2.75INX2 3/8IN POLYUR ADH HYPOALL WTPRF TGDRM STD STRL LF (WOUND CARE SUPPLY) ×3 IMPLANT
DRESSING TRNSPR 2 3/8 X 2.75IN_1624W TEGADERM 100/BX (WOUND CARE/ENTEROSTOMAL SUPPLY) ×3
DSCT LAPSCP CURVE JAW US CRDLS 39CM SONICISION (ENDOSCOPIC SUPPLIES) ×1
ELECTRODE PATIENT RTN 9FT VLAB C30- LB RM PHSV ACRL FOAM CORD NONIRRITATE NONSENSITIZE ADH STRP (SURGICAL CUTTING SUPPLIES) ×1 IMPLANT
ELECTRODE PATIENT RTN 9FT VLAB_REM C30- LB PLHSV ACRL FOAM (CUTTING ELEMENTS) ×1
GOWN SURG 2XL AAMI L4 IMPRV RG_LN SLEEVE BRTHBL STRL LF DISP (DRAPE/PACKS/SHEETS/OR TOWEL)
GOWN SURG 2XL L4 BRTHBL STRL LF  DISP SMARTGOWN (DRAPE/PACKS/SHEETS/OR TOWEL) IMPLANT
GOWN SURG 3XL STD LGTH AAMI L4_HKLP CLSR VNK RGLN SLEEVE (DRAPE/PACKS/SHEETS/OR TOWEL)
GOWN SURG 3XL STD LGTH L4 HKLP CLSR RGLN SLEEVE TWL STRL LF  DISP WHT AERO CR PRFRM FBRC (DRAPE/PACKS/SHEETS/OR TOWEL)
GOWN SURG XL AAMI L3 REINF STR_L BACK SET IN SLEEVE STRL LF (DRAPE/PACKS/SHEETS/OR TOWEL) ×1
GOWN SURG XL L3 REINF SET IN SLEEVE STRL LF  DISP BLU ASTND FBRC (DRAPE/PACKS/SHEETS/OR TOWEL) ×1 IMPLANT
MATTRESS LAT TRANSF PATIENT MA_XIAIR (MED SURG SUPPLIES)
MATTRESS TRANSF 34IN DISP (MED SURG SUPPLIES) ×1 IMPLANT
MATTRESS TRNSPT MAXIAIR (MED SURG SUPPLIES) IMPLANT
MBO USE ITEM 130230 - DEVICE ENDOS ENDO PNUT 5MM 45CM SWAB COTTON DISP (ENDOSCOPIC SUPPLIES) IMPLANT
MBO USE ITEM 306904 - GOWN SURG XL L3 REINF SET IN SLEEVE STRL LF  DISP BLU ASTND FBRC (DRAPE/PACKS/SHEETS/OR TOWEL) ×1
NEEDLE INSFL 120MM 14GA STD SRGNDL PNMPRTN SPRG LD BLUNT STY STRL LF  DISP SS PLASTIC RD (ENDOSCOPIC SUPPLIES) ×1 IMPLANT
NEEDLE INSFL 120MM 14GA STD SR_GNDL PNMPRTN SPRG LD BLUNT STY (INSTRUMENTS ENDOMECHANICAL) ×1
PACK SURG CUSTOM GENERAL LF_HAZARDOUS (CUSTOM TRAYS & PACK) ×1
PI INDICATOR underglove 6.5 ×1 IMPLANT
PLS LAV HDRSRG + NZH DRS SMOKEVAC IRRG PUMP KIT TRMPT VALVE SUCT TUBE SYSTEM STRL (MED SURG SUPPLIES) ×1 IMPLANT
RELOAD ENDO 60 ARTIC VASC MED_EGIA60AVM TRI STAPLE BX6 TAN (SUTURE AIDS)
RELOAD ENDO GIA ROTIC 60 3.5_EGIA60AMT SULU 6EA/BX (SUTURE AIDS)
RELOAD STPLR 2MM 2.5MM 3MM 60MM EGIA TI MED VAS TISS ARTC KNIFE BLADE LGR ANVIL BCKT STRL LF  DISP (SUTURE AIDS) IMPLANT
RELOAD STPLR 60MM TI 3MM 3.5MM 4MM MED THKTIS ARTC KNIFE BLADE LGR ANVIL BCKT STRL LF  DISP EGIA (SUTURE AIDS) IMPLANT
RELOAD STPLR 60MM X THKTIS ARTC STRL LF  DISP BLK SIGNIA STPL SYS (SUTURE AIDS) IMPLANT
RELOAD STPLR MED CURVE 30MM TRI-STPL 2 VAS STRL LF  TAN (SUTURE AIDS) ×2 IMPLANT
RELOAD STPLR SIGNIA PWR SHELL (SUTURE AIDS) ×1
SCISSOR LAPSCP RPSB MONOPOL METZ 31CM STRL DISP 5MM (ENDOSCOPIC SUPPLIES) IMPLANT
SCISSORS LAPSCP METZ RPSB_MNPLR (INSTRUMENTS ENDOMECHANICAL)
SOL ANFG DFGR ISOPRPNL PAD OVAL BTL NABRSV ADH STRL LF  DISP (ENDOSCOPIC SUPPLIES) ×1 IMPLANT
SOL IRRG 0.9% NACL 1000ML PLASTIC PR BTL ISTNC N-PYRG STRL LF (MEDICATIONS/SOLUTIONS) ×2 IMPLANT
SOLUTION ANTI FOG W/SPONGE_280101 DEFOG ENDOMATE 20EA/CS (INSTRUMENTS ENDOMECHANICAL) ×1
SOLUTION IRRG NS 2F7124 1000CC_12/CS (MEDICATIONS/SOLUTIONS) ×2
SPONGE LAP 18 X 18IN STRL_AL1818 5EA/PK 40PK/CS (MED SURG SUPPLIES)
SPONGE LAP 18X18IN STRL (MED SURG SUPPLIES) IMPLANT
STAPLER ENDO GIA ARTIC CVD 30_SIG30CTAVM VAS MED TAN 6EA/BX (SUTURE AIDS) ×2
STAPLER POWER POWER CONTROL SHELL SIGNIA (SUTURE AIDS) ×1 IMPLANT
STAPLER VAS ARTIC X THICK 60MM_SIG60AXT 6EA/BX (SUTURE AIDS)
SUTURE 2-0 UR-6 VICRYL+ 27IN VIOL BRD ANBCTRL COAT ABS (SUTURE/WOUND CLOSURE) ×2 IMPLANT
SUTURE 2-0 UR-6 VICRYL+ 27IN V_IOL BRD COAT ABS (SUTURE/WOUND CLOSURE) ×2
SUTURE 4-0 PS2 MONOCRYL + MTPS 18IN MONOF ANBCTRL ABS (SUTURE/WOUND CLOSURE) ×1 IMPLANT
SUTURE 4-0 PS2 MONOCRYL + MTPS_18IN UNDYED MONOF ABS (SUTURE/WOUND CLOSURE) ×1
SUTURE 4-0 PS2 MONOCRYL MTPS 18IN UNDYED MONOF ABS (SUTURE/WOUND CLOSURE) ×2 IMPLANT
SUTURE 4-0 PS2 MONOCRYL MTPS 1_8IN UNDYED MONOF ABS (SUTURE/WOUND CLOSURE) ×2
SYRINGE LL 10ML LF  STRL GRAD N-PYRG DEHP-FR PVC FREE MED DISP (MED SURG SUPPLIES) ×1 IMPLANT
SYRINGE LL 10ML LF STRL MED D_ISP (MED SURG SUPPLIES) ×1
SYSTEM HSRG+ SMKVC IRRG PMP_KT TRMPT VLV SUCT TUBE WO TIP (MED SURG SUPPLIES) ×1
TOWEL 27X17IN COTTON PREWASH D_ELINT HI ABS BLU DISP SURG (DRAPE/PACKS/SHEETS/OR TOWEL)
TOWEL 27X17IN COTTON STD PREWASH DELINT HI ABS BLU DISP SURG STRL LF (DRAPE/PACKS/SHEETS/OR TOWEL) IMPLANT
TROCAR BLADELESS 5MM ONB5STF_ONB5STF 6EA/BX (INSTRUMENTS ENDOMECHANICAL) ×1
TROCAR LAPSCP LONG 100MM 11MM_VERSAONE FIX CANN BLDLS DLPHN (INSTRUMENTS ENDOMECHANICAL) ×1
TROCAR LAPSCP STD 100MM 11MM VERSAONE FIX CANN BLDLS DLPHN NOSE TIP OPTC STRL LF  DISP (ENDOSCOPIC SUPPLIES) ×1 IMPLANT
TROCAR LAPSCP STD 100MM 5MM VERSAONE FIX CANN BLDLS DLPHN NOSE TIP OPTC STRL LF  DISP (ENDOSCOPIC SUPPLIES) ×1 IMPLANT
TROCAR SLEEVE 5MM CANNULA_UNVCA5STF 6EA/BX (INSTRUMENTS ENDOMECHANICAL) ×1
TUBING INSFL INSUF HIFLO LL FILTER STRL DISP LF (ENDOSCOPIC SUPPLIES) ×1
TUBING INSUFLATION HI FLOW_0620030301 10EA/CS 20LTR (INSTRUMENTS ENDOMECHANICAL) ×1

## 2022-05-11 NOTE — Consults (Signed)
[facility]  General Surgery  Consultation    Date of Service:  05/11/2022  Derrick Cisneros, Derrick Cisneros, 31 y.o. male  Date of Admission:  05/10/2022  Date of Birth:  04-29-1991  PCP: No Pcp    Chief Complaint:  Acute appendicitis    HPI:  Derrick Cisneros is a 31 y.o. White male who is admitted for acute appendicitis.  The patient developed lower abdominal pain yesterday.  He was actually on a [employer] bus.  The bus ride exacerbated his pain.  He denies nausea, vomiting, or fever.  He has never had pain like this.  He was admitted by the hospitalist service yesterday.  He continues to have pain especially with movement.  General surgery was consulted for possible intervention.    Past Medical History:   Diagnosis Date    HTN (hypertension)       History reviewed. No pertinent surgical history.   Social History     Tobacco Use    Smoking status: Never    Smokeless tobacco: Never   Vaping Use    Vaping Use: Every day    Substances: Nicotine    Devices: Refillable tank   Substance Use Topics    Alcohol use: Never    Drug use: Never       Family Medical History:    None        Medications Prior to Admission       Prescriptions    lisinopriL (PRINIVIL) 10 mg Oral Tablet    Take 1 Tablet (10 mg total) by mouth Once a day           Allergies   Allergen Reactions    Naproxen Hives/ Urticaria    Sulfa (Sulfonamides) Hives/ Urticaria        Review of Systems   Constitutional:  Negative for chills, fever and weight loss.   HENT:  Negative for hearing loss and tinnitus.    Eyes:  Negative for blurred vision, double vision and pain.   Respiratory:  Negative for cough, hemoptysis and shortness of breath.    Cardiovascular:  Negative for chest pain, palpitations, claudication and leg swelling.   Gastrointestinal:  Positive for abdominal pain. Negative for blood in stool, constipation, diarrhea, heartburn, melena, nausea and vomiting.   Genitourinary:  Negative for dysuria, frequency and hematuria.    Musculoskeletal:  Negative for back pain and neck pain.   Skin:  Negative for itching and rash.   Neurological:  Negative for speech change, focal weakness and weakness.   Endo/Heme/Allergies:  Negative for environmental allergies. Does not bruise/bleed easily.   Psychiatric/Behavioral:  Negative for depression and hallucinations.           Filed Vitals:    05/10/22 2059 05/10/22 2339 05/11/22 0330 05/11/22 0745   BP: 138/86 132/72 117/69 122/82   Pulse: (!) 127 (!) 144 (!) 129 (!) 119   Resp: 16 16 16 16    Temp: (!) 38.3 C (100.9 F) 38 C (100.4 F) 37.3 C (99.1 F) 36.9 C (98.4 F)   SpO2:            Physical Exam  Vitals and nursing note reviewed. Exam conducted with a chaperone present.   Constitutional:       General: He is not in acute distress.     Appearance: He is not diaphoretic.   HENT:      Head: Normocephalic and atraumatic.   Eyes:      Pupils: Pupils are  equal, round, and reactive to light.   Neck:      Thyroid: No thyromegaly.      Trachea: No tracheal deviation.   Cardiovascular:      Rate and Rhythm: Normal rate and regular rhythm.   Pulmonary:      Effort: Pulmonary effort is normal. No respiratory distress.   Abdominal:      General: There is no distension.      Palpations: Abdomen is soft. There is no mass.      Tenderness: There is abdominal tenderness. There is no guarding or rebound.      Comments: Right lower quadrant abdominal tenderness on palpation with voluntary guarding.   Musculoskeletal:         General: Normal range of motion.      Cervical back: Normal range of motion and neck supple.   Skin:     General: Skin is warm.      Findings: No erythema.   Neurological:      General: No focal deficit present.      Mental Status: He is alert and oriented to person, place, and time.   Psychiatric:         Mood and Affect: Mood and affect normal.         Behavior: Behavior normal.          Laboratory Data:     Results for orders placed or performed during the hospital encounter of  05/10/22 (from the past 24 hour(s))   COMPREHENSIVE METABOLIC PANEL, NON-FASTING   Result Value Ref Range    SODIUM 140 136 - 145 mmol/L    POTASSIUM 3.8 3.5 - 5.1 mmol/L    CHLORIDE 106 96 - 111 mmol/L    CO2 TOTAL 24 22 - 30 mmol/L    ANION GAP 10 4 - 13 mmol/L    BUN 11 8 - 25 mg/dL    CREATININE 0.78 0.75 - 1.35 mg/dL    BUN/CREA RATIO 14 6 - 22    ALBUMIN 4.6 3.5 - 5.0 g/dL     CALCIUM 9.9 8.6 - 10.2 mg/dL    GLUCOSE 147 (H) 65 - 125 mg/dL    ALKALINE PHOSPHATASE 73 45 - 115 U/L    ALT (SGPT) 28 10 - 55 U/L    AST (SGOT)  15 8 - 45 U/L    BILIRUBIN TOTAL 0.9 0.3 - 1.3 mg/dL    PROTEIN TOTAL 7.7 6.4 - 8.3 g/dL    ESTIMATED GFR - MALE >90 >=60 mL/min/BSA   LACTIC ACID LEVEL W/ REFLEX FOR LEVEL >2.0   Result Value Ref Range    LACTIC ACID 1.3 0.5 - 2.2 mmol/L   LIPASE   Result Value Ref Range    LIPASE 12 10 - 60 U/L   CBC WITH DIFF   Result Value Ref Range    WBC 16.6 (H) 3.7 - 11.0 x10^3/uL    RBC 5.44 4.50 - 6.10 x10^6/uL    HGB 15.3 13.4 - 17.5 g/dL    HCT 44.9 38.9 - 52.0 %    MCV 82.5 78.0 - 100.0 fL    MCH 28.1 26.0 - 32.0 pg    MCHC 34.1 31.0 - 35.5 g/dL    RDW-CV 12.7 11.5 - 15.5 %    PLATELETS 321 150 - 400 x10^3/uL    MPV 10.0 8.7 - 12.5 fL    NEUTROPHIL % 81 %    LYMPHOCYTE % 11 %    MONOCYTE % 7 %  EOSINOPHIL % 0 %    BASOPHIL % 0 %    NEUTROPHIL # 13.31 (H) 1.50 - 7.70 x10^3/uL    LYMPHOCYTE # 1.88 1.00 - 4.80 x10^3/uL    MONOCYTE # 1.17 (H) 0.20 - 1.10 x10^3/uL    EOSINOPHIL # <0.10 <=0.50 x10^3/uL    BASOPHIL # <0.10 <=0.20 x10^3/uL    IMMATURE GRANULOCYTE % 1 0 - 1 %    IMMATURE GRANULOCYTE # 0.10 (H) <0.10 x10^3/uL   BLUE TOP TUBE   Result Value Ref Range    RAINBOW/EXTRA TUBE AUTO RESULT Yes    GOLD TOP TUBE   Result Value Ref Range    RAINBOW/EXTRA TUBE AUTO RESULT Yes    DRUG SCREEN, NO CONFIRMATION, URINE   Result Value Ref Range    AMPHETAMINES, URINE Negative Negative    BARBITURATES URINE Negative Negative    BENZODIAZEPINES URINE Negative Negative    BUPRENORPHINE URINE Negative  Negative    CANNABINOIDS URINE Negative Negative    COCAINE METABOLITES URINE Negative Negative    METHADONE URINE Negative Negative    OPIATES URINE (LOW CUTOFF) Positive (A) Negative    OXYCODONE URINE Negative Negative    ECSTASY/MDMA URINE Negative Negative    FENTANYL, RANDOM URINE Negative Negative    CREATININE RANDOM URINE 168 (H) 50 - 100 mg/dL   URINALYSIS, MACROSCOPIC   Result Value Ref Range    COLOR Yellow Yellow, Straw    APPEARANCE Clear Clear, Slightly Hazy    SPECIFIC GRAVITY 1.010 1.005 - 1.030    PH 6.5 5.0 - 8.5    LEUKOCYTES Negative Negative WBCs/uL    NITRITE Negative Negative    PROTEIN Negative Negative mg/dL    GLUCOSE Negative Negative mg/dL    KETONES Negative Negative mg/dL    UROBILINOGEN 0.2 0.2 mg/dL    BILIRUBIN Negative Negative mg/dL    BLOOD Negative Negative mg/dL   URINALYSIS, MICROSCOPIC   Result Value Ref Range    RBCS None None, Occasional, 0-2, 3-5 /hpf    WBCS 0-2 None, Occasional, 0-2, 3-5 /hpf    BACTERIA None None /hpf    SQUAMOUS EPITHELIAL Occasional None, Occasional, 0-2, 3-5 /hpf    MUCOUS Occasional (A) None /hpf   COMPREHENSIVE METABOLIC PANEL, NON-FASTING   Result Value Ref Range    SODIUM 140 136 - 145 mmol/L    POTASSIUM 3.7 3.5 - 5.1 mmol/L    CHLORIDE 109 96 - 111 mmol/L    CO2 TOTAL 23 22 - 30 mmol/L    ANION GAP 8 4 - 13 mmol/L    BUN 15 8 - 25 mg/dL    CREATININE 0.84 0.75 - 1.35 mg/dL    BUN/CREA RATIO 18 6 - 22    ALBUMIN 3.3 (L) 3.5 - 5.0 g/dL     CALCIUM 8.6 8.6 - 10.2 mg/dL    GLUCOSE 128 (H) 65 - 125 mg/dL    ALKALINE PHOSPHATASE 50 45 - 115 U/L    ALT (SGPT) 20 10 - 55 U/L    AST (SGOT)  13 8 - 45 U/L    BILIRUBIN TOTAL 1.6 (H) 0.3 - 1.3 mg/dL    PROTEIN TOTAL 5.9 (L) 6.4 - 8.3 g/dL    ESTIMATED GFR - MALE >90 >=60 mL/min/BSA   MAGNESIUM   Result Value Ref Range    MAGNESIUM 1.6 (L) 1.8 - 2.6 mg/dL   CBC WITH DIFF   Result Value Ref Range    WBC 17.7 (H) 3.7 - 11.0 x10^3/uL  RBC 4.39 (L) 4.50 - 6.10 x10^6/uL    HGB 12.6 (L) 13.4 - 17.5 g/dL    HCT  36.4 (L) 38.9 - 52.0 %    MCV 82.9 78.0 - 100.0 fL    MCH 28.7 26.0 - 32.0 pg    MCHC 34.6 31.0 - 35.5 g/dL    RDW-CV 12.9 11.5 - 15.5 %    PLATELETS 251 150 - 400 x10^3/uL    MPV 10.3 8.7 - 12.5 fL    NEUTROPHIL % 81 %    LYMPHOCYTE % 11 %    MONOCYTE % 7 %    EOSINOPHIL % 0 %    BASOPHIL % 0 %    NEUTROPHIL # 14.33 (H) 1.50 - 7.70 x10^3/uL    LYMPHOCYTE # 1.94 1.00 - 4.80 x10^3/uL    MONOCYTE # 1.28 (H) 0.20 - 1.10 x10^3/uL    EOSINOPHIL # <0.10 <=0.50 x10^3/uL    BASOPHIL # <0.10 <=0.20 x10^3/uL    IMMATURE GRANULOCYTE % 1 0 - 1 %    IMMATURE GRANULOCYTE # <0.10 <0.10 x10^3/uL       Imaging Studies:    CT ABDOMEN PELVIS W IV CONTRAST   Final Result by Edi, Radresults In (10/04 1602)   1. Imaging findings consistent with acute appendicitis.   2. Asymmetric prominence of the right inguinal canal, cannot exclude nondistended testis. Recommend nonemergent testicular ultrasound.      Critical findings were discussed via secure chat with ordering provider Dr. Jennet Maduro of [facility] emergency department at 4:00 PM by Dr. Marybelle Killings. Receipt was confirmed.                        The CT exam was performed using one or more of the following dose reduction techniques: Automated exposure control, adjustment of the mA and/or kV according to the patient's size, or use of iterative reconstruction technique.         Radiologist location ID: [id number]            Images from CT of the abdomen and pelvis obtained on 05/10/2022 were independently reviewed by me.  The appendix is distended with inflammatory change around the appendix.  This is consistent with acute appendicitis.  Avanell Shackleton is not clearly identified.  There was no obvious abscess.  Additional findings are as documented.    Assessment/Plan:  Active Hospital Problems    Diagnosis    Primary Problem: Acute appendicitis    Sepsis    Hypertension       White blood cell count increased overnight.  Patient continues to have right lower quadrant abdominal pain.  I  recommend laparoscopic appendectomy.  I described the procedure in detail and answered the patient's questions.  He agreed to proceed.    Francene Boyers, DO

## 2022-05-11 NOTE — Nurses Notes (Signed)
Patient complaining of pain rating 10/10 to abdomen. PRN fentanyl given.

## 2022-05-11 NOTE — Progress Notes (Signed)
[facility]  Hospitalist  Progress Note    Date of Service:  05/11/2022  Derrick Cisneros, Derrick Cisneros, 31 y.o. male  Date of Admission:  05/10/2022  Date of Birth:  1990/09/30  PCP: No Pcp    HPI:    Patient admitted to hospital for acute appendicitis.  The patient was taken to the OR today for laparoscopic cholecystectomy.  The patient tolerated procedure well.  Labs were reviewed and noted.    fentaNYL (SUBLIMAZE) 50 mcg/mL injection, 50 mcg, Intravenous, Q2H PRN  [Held by provider] lisinopril (PRINIVIL) tablet, 10 mg, Oral, Daily  magnesium hydroxide (MILK OF MAGNESIA) 400mg  per 78mL oral liquid, 15 mL, Oral, Daily PRN  NS flush syringe, 10 mL, Intracatheter, Q8HRS  NS flush syringe, 10 mL, Intracatheter, Q1H PRN  NS premix infusion, , Intravenous, Continuous  ondansetron (ZOFRAN) 2 mg/mL injection, 4 mg, Intravenous, Q6H PRN  piperacillin-tazobactam (ZOSYN) 3.375 g in NS 100 mL IVPB minibag, 3.375 g, Intravenous, Q8H  prochlorperazine (COMPAZINE) 5 mg/mL injection, 10 mg, Intravenous, Q6H PRN         Allergies   Allergen Reactions    Naproxen Hives/ Urticaria    Sulfa (Sulfonamides) Hives/ Urticaria             Filed Vitals:    05/11/22 1614 05/11/22 1636 05/11/22 1650 05/11/22 1705   BP: 138/83 139/81 135/84 (!) 144/82   Pulse: (!) 115 (!) 109 (!) 112 (!) 111   Resp: 16 16 18 16    Temp: 36.6 C (97.9 F) 36.6 C (97.9 F) 36.8 C (98.2 F) 36.7 C (98 F)   SpO2:           Physical Exam  Vitals and nursing note reviewed.   Constitutional:       General: He is awake. He is not in acute distress.     Appearance: Normal appearance. He is well-developed and overweight. He is not ill-appearing.   HENT:      Head: Normocephalic and atraumatic.   Eyes:      Conjunctiva/sclera: Conjunctivae normal.      Pupils: Pupils are equal, round, and reactive to light.   Cardiovascular:      Rate and Rhythm: Normal rate and regular rhythm.      Heart sounds: Normal heart sounds. No murmur heard.     No friction rub.  No gallop.   Pulmonary:      Effort: No respiratory distress.      Breath sounds: Normal breath sounds and air entry. No wheezing or rales.   Abdominal:      General: Bowel sounds are normal. There is no distension.      Palpations: Abdomen is soft. There is no mass.      Tenderness: There is abdominal tenderness in the right lower quadrant and left lower quadrant. There is no guarding or rebound.   Musculoskeletal:         General: No deformity. Normal range of motion.      Cervical back: Normal range of motion and neck supple.   Skin:     General: Skin is warm and dry.      Findings: No erythema or rash.   Neurological:      Mental Status: He is alert and oriented to person, place, and time. Mental status is at baseline.      Cranial Nerves: No cranial nerve deficit.   Psychiatric:         Mood and Affect: Affect normal.  Behavior: Behavior is cooperative.         Cognition and Memory: Cognition and memory normal.         Laboratory Data:     Results for orders placed or performed during the hospital encounter of 05/10/22 (from the past 24 hour(s))   COMPREHENSIVE METABOLIC PANEL, NON-FASTING   Result Value Ref Range    SODIUM 140 136 - 145 mmol/L    POTASSIUM 3.7 3.5 - 5.1 mmol/L    CHLORIDE 109 96 - 111 mmol/L    CO2 TOTAL 23 22 - 30 mmol/L    ANION GAP 8 4 - 13 mmol/L    BUN 15 8 - 25 mg/dL    CREATININE 3.89 3.73 - 1.35 mg/dL    BUN/CREA RATIO 18 6 - 22    ALBUMIN 3.3 (L) 3.5 - 5.0 g/dL     CALCIUM 8.6 8.6 - 42.8 mg/dL    GLUCOSE 768 (H) 65 - 125 mg/dL    ALKALINE PHOSPHATASE 50 45 - 115 U/L    ALT (SGPT) 20 10 - 55 U/L    AST (SGOT)  13 8 - 45 U/L    BILIRUBIN TOTAL 1.6 (H) 0.3 - 1.3 mg/dL    PROTEIN TOTAL 5.9 (L) 6.4 - 8.3 g/dL    ESTIMATED GFR - MALE >90 >=60 mL/min/BSA   MAGNESIUM   Result Value Ref Range    MAGNESIUM 1.6 (L) 1.8 - 2.6 mg/dL   CBC WITH DIFF   Result Value Ref Range    WBC 17.7 (H) 3.7 - 11.0 x10^3/uL    RBC 4.39 (L) 4.50 - 6.10 x10^6/uL    HGB 12.6 (L) 13.4 - 17.5 g/dL    HCT 11.5  (L) 72.6 - 52.0 %    MCV 82.9 78.0 - 100.0 fL    MCH 28.7 26.0 - 32.0 pg    MCHC 34.6 31.0 - 35.5 g/dL    RDW-CV 20.3 55.9 - 74.1 %    PLATELETS 251 150 - 400 x10^3/uL    MPV 10.3 8.7 - 12.5 fL    NEUTROPHIL % 81 %    LYMPHOCYTE % 11 %    MONOCYTE % 7 %    EOSINOPHIL % 0 %    BASOPHIL % 0 %    NEUTROPHIL # 14.33 (H) 1.50 - 7.70 x10^3/uL    LYMPHOCYTE # 1.94 1.00 - 4.80 x10^3/uL    MONOCYTE # 1.28 (H) 0.20 - 1.10 x10^3/uL    EOSINOPHIL # <0.10 <=0.50 x10^3/uL    BASOPHIL # <0.10 <=0.20 x10^3/uL    IMMATURE GRANULOCYTE % 1 0 - 1 %    IMMATURE GRANULOCYTE # <0.10 <0.10 x10^3/uL       Imaging Studies:    CT ABDOMEN PELVIS W IV CONTRAST   Final Result by Edi, Radresults In (10/04 1602)   1. Imaging findings consistent with acute appendicitis.   2. Asymmetric prominence of the right inguinal canal, cannot exclude nondistended testis. Recommend nonemergent testicular ultrasound.      Critical findings were discussed via secure chat with ordering provider Dr. Randel Pigg of [facility] emergency department at 4:00 PM by Dr. Mattie Marlin. Receipt was confirmed.                        The CT exam was performed using one or more of the following dose reduction techniques: Automated exposure control, adjustment of the mA and/or kV according to the patient's size, or use of iterative reconstruction  technique.         Radiologist location ID: [id number]             Assessment/Plan:  Hospital Problems    1Acute appendicitis         Date Noted: 05/10/2022      Sepsis         Date Noted: 05/11/2022      Hypertension         Date Noted: 05/10/2022      Resolved Hospital Problems  No resolved problems to display.      Acute appendicitis.  Status post laparoscopic cholecystectomy.  Continue Zosyn.  Continue normal saline at 125 mL/hour.  Advanced diet per surgery.  Continue fentanyl for pain and Zofran for nausea.  Repeat lab work in the morning.    Hypertension.  Holding p.o. medications.    DVT prophylaxis.  Ted's and  SCDs.    Leonarda Salon, MD

## 2022-05-11 NOTE — Care Management Notes (Signed)
05/11/22 1135   Assessment Details   Assessment Type Admission   Date of Care Management Update 05/11/22   Readmission   Is this a readmission? No   Insurance Information/Type   Insurance type Medicare   Medicare Intent to Discharge Documentation   Admit IMM given to: Patient   Admit IMM letter given date 05/11/22   Admit IMM letter time given 1138   IMM explained/reviewed with:  verbalized understanding;Patient   Employment/Financial   Patient has Prescription Coverage?  Yes        Name of Insurance Coverage for Medications Medicaid   Financial Concerns none   Living Environment   Select an age group to open "lives with" row.  Adult   Lives With sibling(s)   Living Arrangements house   Able to Return to Prior Arrangements yes   [place] Accessibility bed and bath on same level;bed not on first floor   Home Safety Comments states will sleep downstairs   Custody and Legal Status   Do you have a court appointed guardian/conservator? No   Care Management Plan   Discharge Planning Status initial meeting   Projected Discharge Date 05/12/22   Discharge plan discussed with: Patient   CM will evaluate for rehabilitation potential no   Patient choice offered to patient/family no   Form for patient choice reviewed/signed and on chart no   Patient aware of possible cost for ambulance transport?  Yes   Discharge Needs Assessment   Equipment Currently Used at Home none   Equipment Needed After Discharge none   Discharge Facility/Level of Care Needs Home (Patient/Family Member/other)(code 1)   Transportation Available Medicaid transportation;public transportation   Referral Information   Admission Type inpatient   Address Verified verified-no changes   Arrived From home or self-care   ADVANCE DIRECTIVES   Does the Patient have an Advance Directive? No, Information Offered and Refused   Patient Requests Assistance in Having Advance Directive Notarized. N/A   LAY CAREGIVER    Appointed Lay Caregiver? I Decline

## 2022-05-11 NOTE — Anesthesia Postprocedure Evaluation (Signed)
Anesthesia Post Op Evaluation    Patient: Derrick Cisneros  Procedure(s):  APPENDECTOMY LAPAROSCOPIC    Last Vitals:Temperature: 37.1 C (98.8 F) (05/11/22 1535)  Heart Rate: (!) 120 (05/11/22 1555)  BP (Non-Invasive): (!) 142/79 (05/11/22 1555)  Respiratory Rate: (!) 23 (05/11/22 1555)  SpO2: 95 % (05/11/22 1555)    No notable events documented.    Patient is sufficiently recovered from the effects of anesthesia to participate in the evaluation and has returned to their pre-procedure level.  Patient location during evaluation: PACU       Patient participation: complete - patient participated  Level of consciousness: awake and alert and responsive to verbal stimuli  Multimodal Pain Management: Multimodal analgesia used between 6 hours prior to anesthesia start to PACU discharge  Pain score: 0  Pain management: adequate  Airway patency: patent    Anesthetic complications: no  Cardiovascular status: acceptable and tachycardic  Respiratory status: acceptable, spontaneous ventilation, unassisted and nasal cannula  Hydration status: acceptable  Patient post-procedure temperature: Pt Normothermic   PONV Status: Absent  Comments: Patient sitting up in bed, still tachycardic postoperative, afebrile now, redness and edema periorbitally has resolved.

## 2022-05-11 NOTE — Nurses Notes (Signed)
Dr.Hensley at bedside at this time. Jamse Mead, RN

## 2022-05-11 NOTE — OR Surgeon (Signed)
[facility]  OPERATIVE REPORT      Omarrion, Carmer, 31 y.o. male  Date of Procedure:  05/11/22  Date of Birth:  1991/02/04  Service:   PCP:  No Pcp    Procedure Performed:  Laparoscopic appendectomy    Pre-Operative Diagnosis: appendicitis    Post-Operative Diagnosis:  Acute appendicitis    Pertinent Operative Findings:  Acute appendicitis     Attending Surgeon: Francene Boyers, DO     Assistant(s):  See OR record    Anesthesia Type: General    Estimated Blood Loss:  Minimal    Complications (not routinely expected or not inherent to difficulty/nature of procedure):  None    Drains:  None    Specimens/ Cultures:  Appendix          Description of Procedure:   After informed and written consent was obtained, the patient was taken to the OR and placed in supine position. General anesthesia was administered and the patient was intubated. SCD's were place on the bilateral lower extremities. The abdomen and pelvis were prepped and draped in the usual sterile fashion. Infraumbilical incision was created and the Veres needle was inserted. Drop test confirmed intraabdominal position. CO2 was used to insuflate the abdomen. Once the abdomen was sufficiently insuflated, the Veres needle was removed and a 12 mm port was place using optical assistance. Two midline 5 mm incisions were created and 5 mm ports were inserted under direct visualization without difficulty. Care was taken to avoid any vital structures. The appendix was identified in a RLQ and was noted to be grossly inflamed.  Fibrin slough was noted in the right lower quadrant.  Loops of bowel were loosely adherent in the region of the appendix.  These loops of bowel were gently separated with no evidence of trauma.  Fluid was aspirated from the right lower quadrant and pelvis.  The appendix was grasped and the mesoappendix was ligated with an ultrasonic dissector. A stapling device was used to transect the appendix at its base. The  appendix was then removed from the abdomen using Endocatch bag. The operative field was hemostatic. A copious amount of normal saline was used to irrigate the abdomen. CO2 was then evacuated from the abdomen. All ports were then removed from the patients abdomen. The infraumbilical fascia was closed using 2-0 Vicryl suture. All skin incisions were closed using 4-0 Monocryl suture in subcuticular fashion. Sterile dressings were applied to complete the procedure. All sponge and instruments counts were correct times two. The patient was extubated and transferred to PACU in stable and satisfactory condition.      Francene Boyers, DO    This note was partially created using voice recognition software and is inherently subject to errors including those of syntax and "sound alike " substitutions which may escape proof reading.  In such instances, original meaning may be extrapolated by contextual derivation.

## 2022-05-11 NOTE — Nurses Notes (Signed)
Patient taken to OR per staff via bed. Patient left in stable condition.

## 2022-05-11 NOTE — Anesthesia Transfer of Care (Signed)
ANESTHESIA TRANSFER OF CARE   Derrick Cisneros is a 31 y.o. ,male, Weight: 108 kg (237 lb)   had Procedure(s):  APPENDECTOMY LAPAROSCOPIC  performed  05/11/22   Primary Service: Bethanie Dicker, *    Past Medical History:   Diagnosis Date   . HTN (hypertension)       Allergy History as of 05/11/22       NAPROXEN         Noted Status Severity Type Reaction    05/10/22 1439 RoseCheri Rous, GN 05/10/22 Active Low  Hives/ Urticaria              SULFA (SULFONAMIDES)         Noted Status Severity Type Reaction    05/10/22 1439 Wylene Simmer, GN 05/10/22 Active Low  Hives/ Urticaria                  I completed my transfer of care / handoff to the receiving personnel during which we discussed:  Access, Airway, All key/critical aspects of case discussed, Analgesia, Antibiotics, Expectation of post procedure, Fluids/Product, Gave opportunity for questions and acknowledgement of understanding, Labs and PMHx      Post Location: PACU                        Additional Info:Report to [employer], updated on recent dose of benadryl for eye redness and swelling.                                   Last OR Temp: Temperature: 37.1 C (98.8 F)  ABG:  POTASSIUM   Date Value Ref Range Status   05/11/2022 3.7 3.5 - 5.1 mmol/L Final     KETONES   Date Value Ref Range Status   05/10/2022 Negative Negative mg/dL Final     CALCIUM   Date Value Ref Range Status   05/11/2022 8.6 8.6 - 10.2 mg/dL Final     Comment:     Gadolinium-containing contrast can interfere with calcium measurement.     Airway:* No LDAs found *  Blood pressure (!) 142/79, pulse (!) 120, temperature 37.1 C (98.8 F), resp. rate (!) 23, height 1.702 m (5' 7"$ ), weight 108 kg (237 lb), SpO2 95%.

## 2022-05-11 NOTE — Nurses Notes (Signed)
Back from surgery in stable condition. Alert and oriented x3. Lung sounds clear, O2 sat 93-94%2L/NC. Heart sounds tacycardia. Abdomen soft and tender. Dressings to abd x3 dry and intact. Pedal pulses equal bilaterally. Requesting liquids at this time. Jamse Mead, RN

## 2022-05-11 NOTE — Nurses Notes (Signed)
Patient complaining of pain to abdomen rating 10/10. PRN fentanyl given.

## 2022-05-11 NOTE — Anesthesia Preprocedure Evaluation (Addendum)
ANESTHESIA PRE-OP EVALUATION  Planned Procedure: APPENDECTOMY LAPAROSCOPIC  Review of Systems     anesthesia history negative     patient summary reviewed  nursing notes reviewed        Pulmonary   current smoker and past history vaping ,   Cardiovascular    Hypertension and well controlled , Exercise Tolerance: > or = 4 METS        GI/Hepatic/Renal   negative GI/hepatic/renal ROS,         Endo/Other   neg endo/other ROS,       Neuro/Psych/MS   negative neuro/psych ROS,      Cancer    negative hematology/oncology ROS,                 Physical Assessment      Airway       Mallampati: II    TM distance: >3 FB    Neck ROM: full  Mouth Opening: good.  Facial hair  Beard  No endotracheal tube present  No Tracheostomy present    Dental           (+) chipped           Pulmonary    Breath sounds clear to auscultation       Cardiovascular    Rhythm: regular  Rate: Normal       Other findings          Plan  ASA 2 - emergent     Planned anesthesia type: general     general anesthesia with endotracheal tube intubation                  Intravenous induction     Anesthesia issues/risks discussed are: PONV, Dental Injuries, Stroke, Aspiration, Sore Throat, Intraoperative Awareness/ Recall and Cardiac Events/MI.  Anesthetic plan and risks discussed with patient  signed consent obtained        Use of blood products discussed with patient who.      Patient's NPO status is appropriate for Anesthesia.

## 2022-05-12 LAB — CBC WITH DIFF
BASOPHIL #: <0.1 10*3/uL (ref <=0.20)
BASOPHIL %: 0 %
EOSINOPHIL #: <0.1 10*3/uL (ref <=0.50)
EOSINOPHIL %: 0 %
HCT: 33.7 % — ABNORMAL LOW (ref 38.9–52.0)
HGB: 11.2 g/dL — ABNORMAL LOW (ref 13.4–17.5)
IMMATURE GRANULOCYTE #: <0.1 10*3/uL (ref <0.10)
IMMATURE GRANULOCYTE %: 1 % (ref 0–1)
LYMPHOCYTE #: 1.2 10*3/uL (ref 1.00–4.80)
LYMPHOCYTE %: 7 %
MCH: 28 pg (ref 26.0–32.0)
MCHC: 33.2 g/dL (ref 31.0–35.5)
MCV: 84.3 fL (ref 78.0–100.0)
MONOCYTE #: 0.91 10*3/uL (ref 0.20–1.10)
MONOCYTE %: 6 %
MPV: 10.2 fL (ref 8.7–12.5)
NEUTROPHIL #: 13.91 10*3/uL — ABNORMAL HIGH (ref 1.50–7.70)
NEUTROPHIL %: 86 %
PLATELETS: 205 10*3/uL (ref 150–400)
RBC: 4 10*6/uL — ABNORMAL LOW (ref 4.50–6.10)
RDW-CV: 13.1 % (ref 11.5–15.5)
WBC: 16.1 10*3/uL — ABNORMAL HIGH (ref 3.7–11.0)

## 2022-05-12 LAB — COMPREHENSIVE METABOLIC PNL, FASTING
ALBUMIN: 3 g/dL — ABNORMAL LOW (ref 3.5–5.0)
ALKALINE PHOSPHATASE: 50 U/L (ref 45–115)
ALT (SGPT): 23 U/L (ref 10–55)
ANION GAP: 6 mmol/L (ref 4–13)
AST (SGOT): 15 U/L (ref 8–45)
BILIRUBIN TOTAL: 0.5 mg/dL (ref 0.3–1.3)
BUN/CREA RATIO: 15 (ref 6–22)
BUN: 11 mg/dL (ref 8–25)
CALCIUM: 8.8 mg/dL (ref 8.6–10.2)
CHLORIDE: 110 mmol/L (ref 96–111)
CO2 TOTAL: 23 mmol/L (ref 22–30)
CREATININE: 0.72 mg/dL — ABNORMAL LOW (ref 0.75–1.35)
ESTIMATED GFR - MALE: >90 mL/min/BSA (ref >=60)
GLUCOSE: 151 mg/dL — ABNORMAL HIGH (ref 70–99)
POTASSIUM: 4 mmol/L (ref 3.5–5.1)
PROTEIN TOTAL: 6 g/dL — ABNORMAL LOW (ref 6.4–8.3)
SODIUM: 139 mmol/L (ref 136–145)

## 2022-05-12 MED ORDER — ACETAMINOPHEN 325 MG TABLET
650.0000 mg | ORAL_TABLET | ORAL | Status: DC | PRN
Start: 2022-05-12 — End: 2022-05-12
  Administered 2022-05-12: 650 mg via ORAL
  Filled 2022-05-12: qty 2

## 2022-05-12 MED ORDER — AMOXICILLIN 875 MG-POTASSIUM CLAVULANATE 125 MG TABLET
1.0000 | ORAL_TABLET | Freq: Two times a day (BID) | ORAL | 0 refills | Status: AC
Start: 2022-05-12 — End: 2022-05-22

## 2022-05-12 MED ORDER — HYDROCODONE 5 MG-ACETAMINOPHEN 325 MG TABLET
1.0000 | ORAL_TABLET | ORAL | 0 refills | Status: AC | PRN
Start: 2022-05-12 — End: 2022-05-15

## 2022-05-12 NOTE — Progress Notes (Signed)
[facility]  General Surgery  Follow-Up Consultation    Date of Service:  05/12/2022  Derrick Cisneros, Derrick Cisneros, 31 y.o. male  Date of Admission:  05/10/2022  Date of Birth:  10/26/1990  PCP: No Pcp    HPI:  Patient denies pain.  He denies nausea or vomiting.  He wants to eat and go home.    fentaNYL (SUBLIMAZE) 50 mcg/mL injection, 50 mcg, Intravenous, Q2H PRN  [Held by provider] lisinopril (PRINIVIL) tablet, 10 mg, Oral, Daily  magnesium hydroxide (MILK OF MAGNESIA) 400mg  per 90mL oral liquid, 15 mL, Oral, Daily PRN  NS flush syringe, 10 mL, Intracatheter, Q8HRS  NS flush syringe, 10 mL, Intracatheter, Q1H PRN  NS premix infusion, , Intravenous, Continuous  ondansetron (ZOFRAN) 2 mg/mL injection, 4 mg, Intravenous, Q6H PRN  piperacillin-tazobactam (ZOSYN) 3.375 g in NS 100 mL IVPB minibag, 3.375 g, Intravenous, Q8H  prochlorperazine (COMPAZINE) 5 mg/mL injection, 10 mg, Intravenous, Q6H PRN         Allergies   Allergen Reactions    Naproxen Hives/ Urticaria    Sulfa (Sulfonamides) Hives/ Urticaria             Filed Vitals:    05/11/22 2018 05/12/22 0058 05/12/22 0530 05/12/22 0712   BP: (!) 147/87 (!) 140/84 (!) 131/90 (!) 140/80   Pulse: (!) 120 (!) 107 (!) 103 (!) 102   Resp: 20 20 20 16    Temp: 36.7 C (98.1 F) 37 C (98.6 F) 36.7 C (98 F) 36.9 C (98.4 F)   SpO2:           Physical Exam  Vitals and nursing note reviewed. Exam conducted with a chaperone present.   HENT:      Head: Normocephalic.   Pulmonary:      Effort: Pulmonary effort is normal.   Abdominal:      Palpations: Abdomen is soft.   Musculoskeletal:         General: Normal range of motion.   Neurological:      General: No focal deficit present.      Mental Status: He is oriented to person, place, and time.   Psychiatric:         Mood and Affect: Mood normal.         Behavior: Behavior normal.          Laboratory Data:     Results for orders placed or performed during the hospital encounter of 05/10/22 (from the past 24  hour(s))   COMPREHENSIVE METABOLIC PNL, FASTING   Result Value Ref Range    SODIUM 139 136 - 145 mmol/L    POTASSIUM 4.0 3.5 - 5.1 mmol/L    CHLORIDE 110 96 - 111 mmol/L    CO2 TOTAL 23 22 - 30 mmol/L    ANION GAP 6 4 - 13 mmol/L    BUN 11 8 - 25 mg/dL    CREATININE 0.72 (L) 0.75 - 1.35 mg/dL    BUN/CREA RATIO 15 6 - 22    ALBUMIN 3.0 (L) 3.5 - 5.0 g/dL     CALCIUM 8.8 8.6 - 10.2 mg/dL    GLUCOSE 151 (H) 70 - 99 mg/dL    ALKALINE PHOSPHATASE 50 45 - 115 U/L    ALT (SGPT) 23 10 - 55 U/L    AST (SGOT)  15 8 - 45 U/L    BILIRUBIN TOTAL 0.5 0.3 - 1.3 mg/dL    PROTEIN TOTAL 6.0 (L) 6.4 - 8.3 g/dL  ESTIMATED GFR - MALE >90 >=60 mL/min/BSA   CBC WITH DIFF   Result Value Ref Range    WBC 16.1 (H) 3.7 - 11.0 x10^3/uL    RBC 4.00 (L) 4.50 - 6.10 x10^6/uL    HGB 11.2 (L) 13.4 - 17.5 g/dL    HCT 33.7 (L) 38.9 - 52.0 %    MCV 84.3 78.0 - 100.0 fL    MCH 28.0 26.0 - 32.0 pg    MCHC 33.2 31.0 - 35.5 g/dL    RDW-CV 13.1 11.5 - 15.5 %    PLATELETS 205 150 - 400 x10^3/uL    MPV 10.2 8.7 - 12.5 fL    NEUTROPHIL % 86 %    LYMPHOCYTE % 7 %    MONOCYTE % 6 %    EOSINOPHIL % 0 %    BASOPHIL % 0 %    NEUTROPHIL # 13.91 (H) 1.50 - 7.70 x10^3/uL    LYMPHOCYTE # 1.20 1.00 - 4.80 x10^3/uL    MONOCYTE # 0.91 0.20 - 1.10 x10^3/uL    EOSINOPHIL # <0.10 <=0.50 x10^3/uL    BASOPHIL # <0.10 <=0.20 x10^3/uL    IMMATURE GRANULOCYTE % 1 0 - 1 %    IMMATURE GRANULOCYTE # <0.10 <0.10 x10^3/uL       Imaging Studies:    CT ABDOMEN PELVIS W IV CONTRAST   Final Result by Edi, Radresults In (10/04 1602)   1. Imaging findings consistent with acute appendicitis.   2. Asymmetric prominence of the right inguinal canal, cannot exclude nondistended testis. Recommend nonemergent testicular ultrasound.      Critical findings were discussed via secure chat with ordering provider Dr. Jennet Maduro of [facility] emergency department at 4:00 PM by Dr. Marybelle Killings. Receipt was confirmed.                        The CT exam was performed using one or more of the  following dose reduction techniques: Automated exposure control, adjustment of the mA and/or kV according to the patient's size, or use of iterative reconstruction technique.         Radiologist location ID: [id number]              Assessment/Plan:  Hospital Problems    1Acute appendicitis         Date Noted: 05/10/2022      Sepsis         Date Noted: 05/11/2022      Hypertension         Date Noted: 05/10/2022      Resolved Hospital Problems  No resolved problems to display.      Patient is doing well status post laparoscopic appendectomy.  Nothing further to offer from the General surgery standpoint.  Patient can be discharged when deemed appropriate by the primary admitting service.  I would complete a 10 day course of oral antibiotics after discharge.  Augmentin would be an appropriate antibiotic choice.    Francene Boyers, DO

## 2022-05-12 NOTE — Nurses Notes (Signed)
Patient sitting in bed, no complaints of pain or discomfort. Dressings on abdomen are clean, dry and intact. Call bell within reach, no new concerns voiced at this time.

## 2022-05-12 NOTE — Nurses Notes (Signed)
Spoke to [facility] dispatch, transportation will be available outside of hospital at approximately 1500 this afternoon. Patient aware of plan and agreeable.

## 2022-05-12 NOTE — Nurses Notes (Signed)
Pt resting in bed with even respirations and unlabored breathing. No sign of distress at this time plan of care ongoing.

## 2022-05-12 NOTE — Care Plan (Signed)
Problem: Adult Inpatient Plan of Care  Goal: Absence of Hospital-Acquired Illness or Injury  Intervention: Prevent and Manage VTE (Venous Thromboembolism) Risk  Recent Flowsheet Documentation  Taken 05/11/2022 2000 by Lanna Poche, LPN  VTE Prevention/Management: ambulation promoted     Problem: Appendectomy  Goal: Anesthesia/Sedation Recovery  Intervention: Optimize Anesthesia Recovery  Recent Flowsheet Documentation  Taken 05/11/2022 2005 by Lanna Poche, LPN  Safety Promotion/Fall Prevention:   activity supervised   fall prevention program maintained   nonskid shoes/slippers when out of bed   safety round/check completed

## 2022-05-12 NOTE — Nurses Notes (Signed)
Pt rated pain in stomach an 10 out of 10. Pt was given 50 mcg of fentanyl hung in a 50 ml bag per Isador Castille, LPN.

## 2022-05-12 NOTE — Care Management Notes (Signed)
Patient uses [facility] for transportation. Called to arrange transport home . [employer] rep to return call with a time for pick up .

## 2022-05-12 NOTE — Care Plan (Signed)
Problem: Appendectomy  Goal: Anesthesia/Sedation Recovery  Intervention: Optimize Anesthesia Recovery  Recent Flowsheet Documentation  Taken 05/11/2022 2005 by Lanna Poche, LPN  Safety Promotion/Fall Prevention:   activity supervised   fall prevention program maintained   nonskid shoes/slippers when out of bed   safety round/check completed     Problem: Adult Inpatient Plan of Care  Goal: Absence of Hospital-Acquired Illness or Injury  Intervention: Prevent and Manage VTE (Venous Thromboembolism) Risk  Recent Flowsheet Documentation  Taken 05/11/2022 2000 by Lanna Poche, LPN  VTE Prevention/Management: ambulation promoted

## 2022-05-12 NOTE — Nurses Notes (Signed)
Patient discharged home.  AVS reviewed with patient.  A written copy of the AVS and discharge instructions was given to the patient.  Questions sufficiently answered as needed.  Patient encouraged to follow up with PCP as indicated. IV removed, catheter intact, and dressing applied. New medications sent home with patient.  In the event of an emergency, patient instructed to call 911 or go to the nearest emergency room. Patient escorted to front of hospital to await [facility] bus. Patient encouraged to wait closer to time but insisted on waiting on the bench outside so he is able to vape while waiting.

## 2022-05-12 NOTE — Discharge Summary (Signed)
[facility]  DISCHARGE SUMMARY      PATIENT NAME:  Derrick Cisneros, Derrick Cisneros  MRN:  [id number]  DOB:  Apr 01, 1991    INPATIENT ADMISSION DATE: 05/10/2022  DISCHARGE DATE:  05/12/2022    ATTENDING PHYSICIAN: Maxcine Ham, MD  SERVICE: [facility] HOSPITALIST  PRIMARY CARE PHYSICIAN: No Pcp       DISCHARGE DIAGNOSIS:     Active Hospital Problems    Diagnosis Date Noted    Principal Problem: Acute appendicitis [K35.80] 05/10/2022    Sepsis [A41.9] 05/11/2022    Hypertension [I10] 05/10/2022      Resolved Hospital Problems   No resolved problems to display.             REASON FOR HOSPITALIZATION AND HOSPITAL COURSE:    This is a 31 y.o., male  who is admitted for acute appendicitis.  The patient presented to the emergency department with complaints of new onset lower abdominal pain that started this morning when he woke up.  Symptoms have been getting progressively worse.  The patient did have 1 episode diarrhea.  He denies any nausea vomiting.  Patient denies any fevers or chills.  In the emergency department the patient had a white count of 16.6.  Urine drug screen was positive for opiates.  Chemistry panel was unremarkable.  CT of the abdomen pelvis showed findings consistent with acute appendicitis.  Also there was a question of a nondistended testis on the right.  Patient was started on Zosyn in the emergency department.  Patient was admitted to the hospital for further treatment of his acute appendicitis.    While here the patient underwent laparoscopic appendectomy.  He had significant inflammation in the right lower quadrant.  We did continue Zosyn.  The patient is still having fair amount of pain and has leukocytosis.  Overall though he is improving significantly.  His pain is improving he is not had any fever.  He is tolerating p.o. intake.  He has been cleared by surgery but they do recommend 10 more days of antibiotics.  I will send the patient home on 10 days of Augmentin.         DISCHARGE  MEDICATIONS:     Current Discharge Medication List        START taking these medications.        Details   amoxicillin-pot clavulanate 875-125 mg Tablet  Commonly known as: AUGMENTIN   1 Tablet, Oral, 2 TIMES DAILY  Qty: 20 Tablet  Refills: 0     HYDROcodone-acetaminophen 5-325 mg Tablet  Commonly known as: NORCO   1 Tablet, Oral, EVERY 4 HOURS PRN  Qty: 15 Tablet  Refills: 0            CONTINUE these medications - NO CHANGES were made during your visit.        Details   lisinopriL 10 mg Tablet  Commonly known as: PRINIVIL   10 mg, Oral, DAILY  Refills: 0              Filed Vitals:    05/11/22 2018 05/12/22 0058 05/12/22 0530 05/12/22 0712   BP: (!) 147/87 (!) 140/84 (!) 131/90 (!) 140/80   Pulse: (!) 120 (!) 107 (!) 103 (!) 102   Resp: 20 20 20 16    Temp: 36.7 C (98.1 F) 37 C (98.6 F) 36.7 C (98 F) 36.9 C (98.4 F)   SpO2:          Physical Exam  Constitutional:  Appearance: He is obese.      Comments: Pleasant male sitting upright in bed.  Does not appear to be in acute distress   HENT:      Head: Normocephalic and atraumatic.      Right Ear: External ear normal.      Left Ear: External ear normal.      Nose: Nose normal.   Eyes:      Pupils: Pupils are equal, round, and reactive to light.   Cardiovascular:      Rate and Rhythm: Normal rate and regular rhythm.      Heart sounds: Normal heart sounds.   Pulmonary:      Breath sounds: Normal breath sounds.   Abdominal:      General: Bowel sounds are normal. There is no distension.      Palpations: Abdomen is soft.      Tenderness: There is no abdominal tenderness.      Comments: Healing abdominal incisions with mild incisional tenderness.  Present bowel sounds   Musculoskeletal:         General: No tenderness or deformity. Normal range of motion.      Cervical back: Normal range of motion and neck supple.   Skin:     General: Skin is warm and dry.      Findings: No rash.   Neurological:      Mental Status: He is alert and oriented to person, place, and  time.      Cranial Nerves: No cranial nerve deficit.      Deep Tendon Reflexes: Reflexes are normal and symmetric.   Psychiatric:         Mood and Affect: Affect normal.         Cognition and Memory: Memory normal.         Judgment: Judgment normal.             Laboratory Data:     Results for orders placed or performed during the hospital encounter of 05/10/22 (from the past 24 hour(s))   COMPREHENSIVE METABOLIC PNL, FASTING   Result Value Ref Range    SODIUM 139 136 - 145 mmol/L    POTASSIUM 4.0 3.5 - 5.1 mmol/L    CHLORIDE 110 96 - 111 mmol/L    CO2 TOTAL 23 22 - 30 mmol/L    ANION GAP 6 4 - 13 mmol/L    BUN 11 8 - 25 mg/dL    CREATININE 2.20 (L) 0.75 - 1.35 mg/dL    BUN/CREA RATIO 15 6 - 22    ALBUMIN 3.0 (L) 3.5 - 5.0 g/dL     CALCIUM 8.8 8.6 - 25.4 mg/dL    GLUCOSE 270 (H) 70 - 99 mg/dL    ALKALINE PHOSPHATASE 50 45 - 115 U/L    ALT (SGPT) 23 10 - 55 U/L    AST (SGOT)  15 8 - 45 U/L    BILIRUBIN TOTAL 0.5 0.3 - 1.3 mg/dL    PROTEIN TOTAL 6.0 (L) 6.4 - 8.3 g/dL    ESTIMATED GFR - MALE >90 >=60 mL/min/BSA   CBC WITH DIFF   Result Value Ref Range    WBC 16.1 (H) 3.7 - 11.0 x10^3/uL    RBC 4.00 (L) 4.50 - 6.10 x10^6/uL    HGB 11.2 (L) 13.4 - 17.5 g/dL    HCT 62.3 (L) 76.2 - 52.0 %    MCV 84.3 78.0 - 100.0 fL    MCH 28.0 26.0 - 32.0 pg  MCHC 33.2 31.0 - 35.5 g/dL    RDW-CV 47.6 54.6 - 50.3 %    PLATELETS 205 150 - 400 x10^3/uL    MPV 10.2 8.7 - 12.5 fL    NEUTROPHIL % 86 %    LYMPHOCYTE % 7 %    MONOCYTE % 6 %    EOSINOPHIL % 0 %    BASOPHIL % 0 %    NEUTROPHIL # 13.91 (H) 1.50 - 7.70 x10^3/uL    LYMPHOCYTE # 1.20 1.00 - 4.80 x10^3/uL    MONOCYTE # 0.91 0.20 - 1.10 x10^3/uL    EOSINOPHIL # <0.10 <=0.50 x10^3/uL    BASOPHIL # <0.10 <=0.20 x10^3/uL    IMMATURE GRANULOCYTE % 1 0 - 1 %    IMMATURE GRANULOCYTE # <0.10 <0.10 x10^3/uL       Imaging Studies:    CT ABDOMEN PELVIS W IV CONTRAST   Final Result by Edi, Radresults In (10/04 1602)   1. Imaging findings consistent with acute appendicitis.   2. Asymmetric  prominence of the right inguinal canal, cannot exclude nondistended testis. Recommend nonemergent testicular ultrasound.      Critical findings were discussed via secure chat with ordering provider Dr. Randel Pigg of [facility] emergency department at 4:00 PM by Dr. Mattie Marlin. Receipt was confirmed.                        The CT exam was performed using one or more of the following dose reduction techniques: Automated exposure control, adjustment of the mA and/or kV according to the patient's size, or use of iterative reconstruction technique.         Radiologist location ID: [id number]             DISCHARGE INSTRUCTIONS:   Follow-up Information       [facility] .    Specialty: General Surgery  Contact information:  [address]  [place] [address]  [phone]                            DISCHARGE INSTRUCTION - DIET     Diet: RESUME HOME DIET      DISCHARGE INSTRUCTION - ACTIVITY     Activity: NO LIFTING OVER 10 POUNDS FOR 2 WEEKS      FOLLOW-UP: GENERAL SURGERY - [facility] - [place], [place]    If you were released from the hospital or emergency department prior to your follow up appointment being scheduled, you will receive notice of your appointment date and time by mail or phone.    To schedule or change appointment:   [place] - GENERAL SURGERY  - [facility]   [phone]         Follow-up in: 2 WEEKS    Reason for visit: HOSPITAL DISCHARGE             CONDITION ON DISCHARGE: Alert    DISCHARGE DISPOSITION:  {Home discharge     I spent a total of (30) minutes in direct/indirect care of this patient including initial evaluation, review of laboratory, radiology, diagnostic studies, review of medical record, order entry and coordination of care.     Eugenio Hoes, PA-C    Care Team       PCP       Name Type Specialty Phone Number    Pcp, No Not available Not available Not available  Care Team       No  care team found                   I personally saw and evaluated the patient as part of a shared service with an APP.    My substantive findings are:  SUBSTANTIVE FINDINGS: MDM (complete) patient is admitted with acute appendicitis.  The patient taken to the OR and had laparoscopic appendectomy. Patient did quite well will be discharged home today.  Patient be on 10 more days of Augmentin.    I independently of the APP spent a total of (20) minutes in direct/indirect care of this patient including initial evaluation, review of laboratory, radiology, diagnostic studies, review of medical record, order entry and coordination of care.

## 2022-05-15 ENCOUNTER — Telehealth (HOSPITAL_COMMUNITY): Payer: Self-pay

## 2022-05-15 NOTE — Telephone Encounter (Signed)
Transition of Care Contact Information  Discharge Date: 05/12/2022  Transition Facility Type--Hospital (Inpatient or Observation)  [facility]  Interactive Contact(s): Completed or attempted contact indicated by Date/Time  First Attempt Call: 05/15/2022 11:00 AM  Second Attempted Contact: 05/15/2022  1:28 PM  Contact Method(s)-- Patient/Caregiver Telephone  Clinical Staff Name/Role who Epifania Gore, LPN  Transition Note:  Unable to Complete TCM due to:  Further information may be documented in relevant telephone or outreach encounter.

## 2022-05-16 LAB — SURGICAL PATHOLOGY SPECIMEN

## 2022-05-26 ENCOUNTER — Encounter (INDEPENDENT_AMBULATORY_CARE_PROVIDER_SITE_OTHER): Payer: Self-pay | Admitting: Surgery

## 2022-07-06 ENCOUNTER — Emergency Department
Admission: EM | Admit: 2022-07-06 | Discharge: 2022-07-06 | Disposition: A | Discharge status: Home / Self Care | Payer: Commercial Managed Care - PPO | Attending: Emergency Medicine | Admitting: Emergency Medicine

## 2022-07-06 ENCOUNTER — Encounter (HOSPITAL_COMMUNITY): Payer: Self-pay

## 2022-07-06 ENCOUNTER — Other Ambulatory Visit: Payer: Self-pay

## 2022-07-06 DIAGNOSIS — W540XXA Bitten by dog, initial encounter: Secondary | ICD-10-CM | POA: Insufficient documentation

## 2022-07-06 DIAGNOSIS — Z23 Encounter for immunization: Secondary | ICD-10-CM | POA: Insufficient documentation

## 2022-07-06 DIAGNOSIS — F17290 Nicotine dependence, other tobacco product, uncomplicated: Secondary | ICD-10-CM | POA: Insufficient documentation

## 2022-07-06 DIAGNOSIS — S01459A Open bite of unspecified cheek and temporomandibular area, initial encounter: Secondary | ICD-10-CM | POA: Insufficient documentation

## 2022-07-06 MED ORDER — AMOXICILLIN 875 MG-POTASSIUM CLAVULANATE 125 MG TABLET
1.0000 | ORAL_TABLET | ORAL | Status: AC
Start: 2022-07-06 — End: 2022-07-06
  Administered 2022-07-06: 1 via ORAL
  Filled 2022-07-06: qty 1

## 2022-07-06 MED ORDER — AMOXICILLIN 875 MG-POTASSIUM CLAVULANATE 125 MG TABLET
1.0000 | ORAL_TABLET | Freq: Two times a day (BID) | ORAL | 0 refills | Status: AC
Start: 2022-07-06 — End: 2022-07-16

## 2022-07-06 MED ORDER — NEOMYCIN-BACITRACN ZN-POLYMYXN 3.5 MG-400 UNIT-5,000 UNIT TOP OINT PKT
1.0000 | TOPICAL_OINTMENT | CUTANEOUS | Status: AC
Start: 2022-07-06 — End: 2022-07-06
  Administered 2022-07-06: 1 via TOPICAL
  Filled 2022-07-06: qty 1

## 2022-07-06 MED ORDER — DIPHTH,PERTUSSIS(ACEL),TETANUS 2.5 LF UNIT-8 MCG-5 LF/0.5ML IM SYRINGE
0.5000 mL | INJECTION | INTRAMUSCULAR | Status: AC
Start: 2022-07-06 — End: 2022-07-06
  Administered 2022-07-06: 0.5 mL via INTRAMUSCULAR
  Filled 2022-07-06: qty 0.5

## 2022-07-06 NOTE — ED Triage Notes (Signed)
Pt reports approx 0400 this morning he was petting his dog when it bit him on his L cheek   Pt reports he is unsure if he is up to date on his tetanus   Pt reports his dog is a pit bull and is up to date on his vaccinations   Pt presents to ED with a laceration to L cheek, bleeding controlled

## 2022-07-06 NOTE — Discharge Instructions (Signed)
Because the dog is up-to-date on shots, think there is not a high degree of indication for rabies vaccination  Prophylactic antibiotics are recommended.  A prescription has been sent to [facility] in [place]  Tetanus vaccination has been given today  Close follow-up if symptoms infection develop including redness drainage or other findings  We have elected not to put any closures on this wound, do not think there is any cosmetic benefit for that it puts at a greater risk for abscess and infection

## 2022-07-06 NOTE — ED Nurses Note (Signed)
Pt A&Ox4, stable, AVS reviewed, pt verbalized understanding, denied any questions, pt ambulatory to parking lot, unaccompanied

## 2022-07-06 NOTE — ED Provider Notes (Signed)
[facility]  Emergency Department  Provider Note    Name: Derrick Cisneros  Age and Gender: 31 y.o. male  Date of Birth: 1991-03-14  Date of Service: 07/06/2022  [place] [id number]  [phone] (home)  MRN: [id number]  PCP: No Pcp    Chief Complaint:   Chief Complaint   Patient presents with    Animal Bite       HPI:    Animal Bite        This is a 31 y.o. male who presents to the emergency department complaints of a dog bite to his cheek.  Apparently dog was a own pet.  He is a ball.  Apparently he was tunneling around with a dog and it went to bite rope and got his cheek instead.  The dog is up-to-date on all of its shots.  Patient is not up-to-date on his tetanus shot.    Past Medical / Surgical / Social History:  Past Medical History:   Past Medical History:   Diagnosis Date    HTN (hypertension)        Past Surgical History:   Past Surgical History:   Procedure Laterality Date    Hx adenoidectomy         Social History:   Social History     Tobacco Use    Smoking status: Never    Smokeless tobacco: Never   Vaping Use    Vaping Use: Every day    Substances: Nicotine, Flavoring    Devices: Refillable tank   Substance Use Topics    Alcohol use: Never    Drug use: Never      Social History     Substance and Sexual Activity   Drug Use Never       Allergies  Allergies:   Allergies   Allergen Reactions    Naproxen Hives/ Urticaria    Sulfa (Sulfonamides) Hives/ Urticaria       Medications  Medications Prior to Admission       Prescriptions    lisinopriL (PRINIVIL) 10 mg Oral Tablet    Take 1 Tablet (10 mg total) by mouth Once a day            ROS:  ROS : Per HPI unless otherwise specified      Physical Exam:  ED Triage Vitals [07/06/22 0550]   BP (Non-Invasive) (!) 147/103   Heart Rate 93   Respiratory Rate 16   Temperature 36.2 C (97.2 F)   SpO2 95 %   Weight 93 kg (205 lb)   Height 1.702 m (5\' 7" )     Body mass index is 32.11 kg/m.    Physical Exam  Vitals and nursing note reviewed.    Constitutional:       General: He is not in acute distress.     Appearance: Normal appearance. He is not ill-appearing, toxic-appearing or diaphoretic.   HENT:      Head:        Comments: 3 cm superficial laceration of the left cheek.  About 1 mm deep in his not through the subcutaneous tissue. No gaping of the wound.  Eyes:      Pupils: Pupils are equal, round, and reactive to light.   Pulmonary:      Effort: Pulmonary effort is normal.   Abdominal:      Palpations: Abdomen is soft.   Musculoskeletal:      Cervical back: Neck supple.   Skin:  General: Skin is warm and dry.   Neurological:      Mental Status: He is alert and oriented to person, place, and time.   Psychiatric:         Mood and Affect: Affect normal.         Procedures  :  No    Emergency Department Testing:    Orders Placed This Encounter    diphtheria, pertussis-acell, tetanus (BOOSTRIX) IM injection    neomycin-bacitracin-polymyxin (NEOSPORIN) topical ointment packet    amoxicillin-clavulanate (AUGMENTIN) 875-125mg  per tablet    amoxicillin-pot clavulanate (AUGMENTIN) 875-125 mg Oral Tablet     (See Results At [facility] if applicable)    Clinical Impression:   Clinical Impression   Dog bite, initial encounter (Primary)       ED Course / MDM / Plan:   Patient was triaged, vital signs were obtained, patient was  placed in a room.  On exam patient alert he is in no acute distress.  Laceration left cheek superficial not requiring any stabilizing laceration repair.  Differential diagnosis considered but not limited to:  Superficial cheek laceration  ED Course / plan:  Dog is known to be up-to-date on its vaccinations  It is own by the patient  The dog can be quarantine for the next week  Animal Control has been contacted by the patient  Prophylactic antibiotics initiated  Tetanus booster initiated  No indication for rabies vaccination given the above factor  Close follow-up with any signs or symptoms of  Wound care instruction    Medical Decision  Making  Problems Addressed:  Dog bite, initial encounter: acute illness or injury     Details: See discussion    Risk  OTC drugs.  Prescription drug management.              Medications Administered in the ED   diphtheria, pertussis-acell, tetanus (BOOSTRIX) IM injection (has no administration in time range)   neomycin-bacitracin-polymyxin (NEOSPORIN) topical ointment packet (has no administration in time range)   amoxicillin-clavulanate (AUGMENTIN) 875-125mg  per tablet (has no administration in time range)       Disposition: Discharged  Meds Prescribed:  New Prescriptions    AMOXICILLIN-POT CLAVULANATE (AUGMENTIN) 875-125 MG ORAL TABLET    Take 1 Tablet by mouth Twice daily for 10 days      BP (!) 147/103   Pulse 93   Temp 36.2 C (97.2 F)   Resp 16   Ht 1.702 m (5\' 7" )   Wt 93 kg (205 lb)   SpO2 95%   BMI 32.11 kg/m       [facility]  [address]  [place]  [phone]    As needed        [phone], MD  07/06/2022  06:09  Department of Emergency Medicine  [place] [facility] [place]  ________________________________     No results found for this or any previous visit (from the past 12 hour(s)).    Labs Ordered/Reviewed - No data to display    No orders to display       This note was partially created using voice recognition software and is inherently subject to errors including those of syntax and "sound alike " substitutions which may escape proof reading.  In such instances, original meaning may be extrapolated by contextual derivation.

## 2022-09-10 ENCOUNTER — Encounter (HOSPITAL_COMMUNITY): Payer: Self-pay | Admitting: Physician Assistant

## 2022-09-10 ENCOUNTER — Other Ambulatory Visit: Payer: Self-pay

## 2022-09-10 ENCOUNTER — Emergency Department
Admission: EM | Admit: 2022-09-10 | Discharge: 2022-09-10 | Disposition: A | Discharge status: Home / Self Care | Payer: Commercial Managed Care - PPO | Attending: Physician Assistant | Admitting: Physician Assistant

## 2022-09-10 DIAGNOSIS — S61019A Laceration without foreign body of unspecified thumb without damage to nail, initial encounter: Secondary | ICD-10-CM

## 2022-09-10 DIAGNOSIS — S61012A Laceration without foreign body of left thumb without damage to nail, initial encounter: Secondary | ICD-10-CM | POA: Insufficient documentation

## 2022-09-10 DIAGNOSIS — Y999 Unspecified external cause status: Secondary | ICD-10-CM

## 2022-09-10 DIAGNOSIS — Y929 Unspecified place or not applicable: Secondary | ICD-10-CM

## 2022-09-10 DIAGNOSIS — W260XXA Contact with knife, initial encounter: Secondary | ICD-10-CM | POA: Insufficient documentation

## 2022-09-10 DIAGNOSIS — Y9389 Activity, other specified: Secondary | ICD-10-CM

## 2022-09-10 DIAGNOSIS — Z23 Encounter for immunization: Secondary | ICD-10-CM

## 2022-09-10 NOTE — ED Nurses Note (Signed)
Pt to ER 1 via EMS. Pt states he was fixing dinner and sliced left thumb open with butcher knife. Bleeding controlled, no active bleeding noted.

## 2022-09-10 NOTE — ED Provider Notes (Signed)
[facility]  ED Primary Provider Note  History of Present Illness   Chief Complaint   Patient presents with    Laceration     Left thumb     Derrick Cisneros is a 32 y.o. male who had concerns including Laceration.  Arrival: The patient arrived by Ambulance    Patient is a 32 year old male presents emergency department with complaints of laceration to left thumb.  He was cutting carrots.  It slipped and cut him on the tip of the thumb.  He reports bleeding denies dysfunction or continued pain.  Tetanus was 1 year ago.      History Reviewed This Encounter: Medical History  Surgical History  Family History  Social History    Physical Exam   ED Triage Vitals [09/10/22 0042]   BP (Non-Invasive) (!) 164/95   Heart Rate 94   Respiratory Rate 20   Temperature 36.7 C (98 F)   SpO2 97 %   Weight 93 kg (205 lb)   Height 1.702 m (5\' 7" )     Physical Exam  Constitutional:       Appearance: Normal appearance.   HENT:      Head: Normocephalic.      Nose: Nose normal.      Mouth/Throat:      Mouth: Mucous membranes are moist.   Eyes:      Extraocular Movements: Extraocular movements intact.      Pupils: Pupils are equal, round, and reactive to light.   Cardiovascular:      Rate and Rhythm: Normal rate and regular rhythm.      Pulses: Normal pulses.      Heart sounds: Normal heart sounds.   Pulmonary:      Effort: Pulmonary effort is normal.      Breath sounds: Normal breath sounds.   Abdominal:      Palpations: Abdomen is soft.   Musculoskeletal:      Cervical back: Normal range of motion.   Skin:     Comments: 1 cm laceration over the left tip of the thumb currently hemostatic   Neurological:      Mental Status: He is alert.       Patient Data   Labs Ordered/Reviewed - No data to display  No orders to display     Medical Decision Making        Medical Decision Making  Dermabond applied tetanus up-to-date aftercare instructions given patient follow up with primary care discussed signs of  infection                Clinical Impression   Laceration of thumb (Primary)       Disposition: Discharged

## 2022-09-10 NOTE — Discharge Instructions (Signed)
Keep the area clean and dry wash daily soap and water pat dry gently.  Follow up with the primary care to make sure getting better.  Observe closely for any redness swelling or other concerns return to the emergency department

## 2022-09-10 NOTE — ED Nurses Note (Signed)
Pt discharged home. Pt understands discharge instructions. Pt out of ER via ambulation.

## 2022-09-10 NOTE — ED Triage Notes (Addendum)
EMS called for left thumb laceration after cutting carrots with butcher knife. Bleeding controlled PTA. BS 128.

## 2022-09-11 ENCOUNTER — Ambulatory Visit (INDEPENDENT_AMBULATORY_CARE_PROVIDER_SITE_OTHER): Payer: Commercial Managed Care - PPO

## 2022-09-28 ENCOUNTER — Ambulatory Visit (INDEPENDENT_AMBULATORY_CARE_PROVIDER_SITE_OTHER): Payer: Commercial Managed Care - PPO

## 2022-10-03 ENCOUNTER — Ambulatory Visit (INDEPENDENT_AMBULATORY_CARE_PROVIDER_SITE_OTHER): Payer: Commercial Managed Care - PPO

## 2022-10-25 ENCOUNTER — Encounter (HOSPITAL_COMMUNITY): Payer: Self-pay | Admitting: Emergency Medicine

## 2022-10-25 ENCOUNTER — Emergency Department
Admission: EM | Admit: 2022-10-25 | Discharge: 2022-10-26 | Disposition: A | Discharge status: Home / Self Care | Payer: Commercial Managed Care - PPO | Attending: Emergency Medicine | Admitting: Emergency Medicine

## 2022-10-25 ENCOUNTER — Other Ambulatory Visit: Payer: Self-pay

## 2022-10-25 DIAGNOSIS — R059 Cough, unspecified: Secondary | ICD-10-CM | POA: Insufficient documentation

## 2022-10-25 DIAGNOSIS — X500XXA Overexertion from strenuous movement or load, initial encounter: Secondary | ICD-10-CM

## 2022-10-25 DIAGNOSIS — Z1152 Encounter for screening for COVID-19: Secondary | ICD-10-CM | POA: Insufficient documentation

## 2022-10-25 DIAGNOSIS — S29011A Strain of muscle and tendon of front wall of thorax, initial encounter: Secondary | ICD-10-CM | POA: Insufficient documentation

## 2022-10-25 DIAGNOSIS — X58XXXA Exposure to other specified factors, initial encounter: Secondary | ICD-10-CM | POA: Insufficient documentation

## 2022-10-25 NOTE — ED Provider Notes (Signed)
[facility]  ED Primary Provider Note        Arrival: The patient arrived by Ambulance     History of Present Illness   chief complaint  Derrick Cisneros is a 32 y.o. male who had concerns including Cough and Rib Pain.  Patient 32 year old male presents range room with a chief complaint of cough x4 days with left-sided rib pain.  Patient states his pain is more of an ache.  It was worse when he takes a deep breath and cough.  He denies any fever, cough is nonproductive.  He states he usually gets these types of symptoms this time of year.  He does vape.  Denies any ill contacts.  He was sole medication is lisinopril.  He was afebrile.  Oxygen saturation 98% on room air.  All nursing notes reviewed        Review of Systems     No other overt Review of Systems are noted to be positive except noted in the HPI.      Historical Data   History Reviewed This Encounter: Medical History  Surgical History  Family History  Social History      Physical Exam   ED Triage Vitals [10/25/22 2348]   BP (Non-Invasive) (!) 168/103   Heart Rate (!) 108   Respiratory Rate 18   Temperature 37.1 C (98.7 F)   SpO2 98 %   Weight 93 kg (205 lb)   Height 1.702 m (5\' 7" )         Exam:   Constitutional:  Patient alert orient x3 in no apparent distress.  No limitations.  Head: Atraumatic normocephalic  Eyes :  Pupils are equal round reactive to light and accommodation extraocular muscles are intact.  Sclera and conjunctiva are unremarkable  Ears:  Tympanic membranes are pearly gray bilaterally; external auditory canals are unremarkable; external ears without any lesions  Nose:  Nares are patent turbinates are pink and moist  Mouth:  Mucosa is pink and moist without lesions.  Posterior pharynx is pink and moist without hypertrophy/exudate.  Neck:  Soft and supple without palpable lymphadenopathy.  Chest: Patient was tender to palpation over the left lateral/posterior lower ribs.  No step-off or deformity.   No rashes or lesions.  Pain is worse with deep breath or coughing.  Heart:  Regular rate and rhythm without audible murmur  Lungs:  Clear to auscultation bilaterally without any wheezing/rales/rhonchi  Abdomen:  Soft nontender without any rebound or guarding; positive bowel sounds throughout  Genitalia:  Deferred  Skin:  Warm and dry without lesions.  Normal skin turgor.  Brisk capillary refill distally  Extremities:  Good strength bilaterally with full range of motion of upper and lower extremities.  Neuro:  Alert oriented x3.  Cranial nerves II-XII grossly intact as tested.  Excellent sensation distally over all dermatomes.  Psychiatric:  Patient cooperative, affect appropriate, insight and judgment good          Procedures      Patient Data     Labs Ordered/Reviewed   C-REACTIVE PROTEIN(CRP),INFLAMMATION - Abnormal; Notable for the following components:       Result Value    C-REACTIVE PROTEIN (CRP) 0.7 (*)     All other components within normal limits   COMPREHENSIVE METABOLIC PANEL, NON-FASTING - Abnormal; Notable for the following components:    ALT (SGPT) 62 (*)     ALBUMIN/GLOBULIN RATIO 1.8 (*)     GLOBULIN 2.5 (*)  All other components within normal limits    Narrative:     Estimated Glomerular Filtration Rate (eGFR) is calculated using the CKD-EPI (2021) equation, intended for patients 88 years of age and older. If gender is not documented or "unknown", there will be no eGFR calculation.     CBC WITH DIFF - Abnormal; Notable for the following components:    WBC 12.8 (*)     NEUTROPHIL # 8.40 (*)     LYMPHOCYTE # 3.30 (*)     All other components within normal limits   COVID-19, FLU A/B, RSV RAPID BY PCR - Normal    Narrative:     Results are for the simultaneous qualitative identification of SARS-CoV-2 (formerly 2019-nCoV), Influenza A, Influenza B, and RSV RNA. These etiologic agents are generally detectable in nasopharyngeal and nasal swabs during the ACUTE PHASE of infection. Hence, this test is  intended to be performed on respiratory specimens collected from individuals with signs and symptoms of upper respiratory tract infection who meet Centers for Disease Control and Prevention (CDC) clinical and/or epidemiological criteria for Coronavirus Disease 2019 (COVID-19) testing. CDC COVID-19 criteria for testing on human specimens is available at [facility] webpage information for Healthcare Professionals: Coronavirus Disease 2019 (COVID-19) ([employer]).     False-negative results may occur if the virus has genomic mutations, insertions, deletions, or rearrangements or if performed very early in the course of illness. Otherwise, negative results indicate virus specific RNA targets are not detected, however negative results do not preclude SARS-CoV-2 infection/COVID-19, Influenza, or Respiratory syncytial virus infection. Results should not be used as the sole basis for patient management decisions. Negative results must be combined with clinical observations, patient history, and epidemiological information. If upper respiratory tract infection is still suspected based on exposure history together with other clinical findings, re-testing should be considered.    Disclaimer:   This assay has been authorized by FDA under an Emergency Use Authorization for use in laboratories certified under the Clinical Laboratory Improvement Amendments of 1988 (CLIA), 42 U.S.C. [phone], to perform high complexity tests. The impacts of vaccines, antiviral therapeutics, antibiotics, chemotherapeutic or immunosuppressant drugs have not been evaluated.     Test methodology:   Cepheid Xpert Xpress SARS-CoV-2/Flu/RSV Assay real-time polymerase chain reaction (RT-PCR) test on the GeneXpert Dx and Xpert Xpress systems.   LACTIC ACID LEVEL W/ REFLEX FOR LEVEL >2.0 - Normal   ADULT ROUTINE BLOOD CULTURE, SET OF 2 BOTTLES (BACTERIA AND YEAST)   ADULT ROUTINE BLOOD CULTURE, SET OF 2 BOTTLES (BACTERIA  AND YEAST)   CBC/DIFF    Narrative:     The following orders were created for panel order CBC/DIFF.  Procedure                               Abnormality         Status                     ---------                               -----------         ------                     CBC WITH IP:[id number]                Abnormal  Final result                 Please view results for these tests on the individual orders.       XR CHEST PA AND LATERAL   Final Result by Edi, Radresults In (03/21 0024)   NEGATIVE CHEST         Radiologist location ID: [id number] Decision Making          Medical Decision Making  Two-view chest shows no acute infiltrate, no acute disease process.  COVID/flu/RSV are negative.  CBC showed a white count 12.8 otherwise unremarkable.  Lactic 1.4 with a CRP of 0.7.  CMP was unremarkable.  Patient finding are consistent with chest wall strain secondary to cough.  Patient given ibuprofen 800 mg p.o. here in emergency room.  He will be discharged home with ibuprofen 600 mg 1 p.o. q.6h p.r.n. pain 20.  Patient also advised to cut back/stop vaping; see discharge instructions for detailed    Amount and/or Complexity of Data Reviewed  Labs: ordered. Decision-making details documented in ED Course.  Radiology: ordered and independent interpretation performed. Decision-making details documented in ED Course.    Risk  Prescription drug management.    Critical Care  Total time providing critical care: 0 minutes        ED Course as of 10/26/22 0223   Thu Oct 26, 2022   0051 White count is 12.8 otherwise CBC unremarkable   0051 Lactic acid 1.4   0052 CRP 0.7   0052 Two-view chest shows no acute infiltrate, no acute disease process   0112 COVID/flu/RSV are negative   0156 CMP unremarkable         Medications Administered in the ED   ibuprofen (MOTRIN) tablet (has no administration in time range)       Following the history, physical exam, and ED workup, the patient was deemed stable  and suitable for discharge. The patient/caregiver was advised to return to the ED for any new or worsening symptoms. Discharge medications, and follow-up instructions were discussed with the patient/caregiver in detail, who verbalizes understanding. The patient/caregiver is in agreement and is comfortable with the plan of care.    Disposition: Discharged         Current Discharge Medication List        START taking these medications.        Details   Ibuprofen 600 mg Tablet  Commonly known as: MOTRIN   600 mg, Oral, 4 TIMES DAILY PRN  Qty: 20 Tablet  Refills: 0            CONTINUE these medications - NO CHANGES were made during your visit.        Details   lisinopriL 10 mg Tablet  Commonly known as: PRINIVIL   10 mg, Oral, DAILY  Refills: 0            Follow up:   Follow-up with family doctor for recheck on Monday/Tuesday                       Clinical Impression   Chest wall muscle strain, initial encounter (Primary)   Cough, unspecified type         Current Discharge Medication List        START taking these medications    Details   Ibuprofen (MOTRIN) 600 mg Oral Tablet Take 1 Tablet (600  mg total) by mouth Four times a day as needed for Pain  Qty: 20 Tablet, Refills: 0             R.A. Baldwin [place], DO  Department of Emergency Medicine

## 2022-10-25 NOTE — ED Triage Notes (Signed)
"  When I cough it hurts really bad on my side" left lateral rib pain x two days, "I vape"

## 2022-10-26 ENCOUNTER — Emergency Department (HOSPITAL_COMMUNITY): Payer: Commercial Managed Care - PPO

## 2022-10-26 LAB — COMPREHENSIVE METABOLIC PANEL, NON-FASTING
ALBUMIN/GLOBULIN RATIO: 1.8 — ABNORMAL HIGH (ref 0.8–1.4)
ALBUMIN: 4.5 g/dL (ref 3.5–5.7)
ALKALINE PHOSPHATASE: 60 U/L (ref 34–104)
ALT (SGPT): 62 U/L — ABNORMAL HIGH (ref 7–52)
ANION GAP: 10 mmol/L (ref 4–13)
AST (SGOT): 20 U/L (ref 13–39)
BILIRUBIN TOTAL: 0.3 mg/dL (ref 0.3–1.2)
BUN/CREA RATIO: 20 (ref 6–22)
BUN: 16 mg/dL (ref 7–25)
CALCIUM, CORRECTED: 9.2 mg/dL (ref 8.9–10.8)
CALCIUM: 9.6 mg/dL (ref 8.6–10.3)
CHLORIDE: 103 mmol/L (ref 98–107)
CO2 TOTAL: 24 mmol/L (ref 21–31)
CREATININE: 0.8 mg/dL (ref 0.60–1.30)
ESTIMATED GFR: 120 mL/min/{1.73_m2} (ref >59)
GLOBULIN: 2.5 — ABNORMAL LOW (ref 2.9–5.4)
GLUCOSE: 85 mg/dL (ref 74–109)
OSMOLALITY, CALCULATED: 274 mOsm/kg (ref 270–290)
POTASSIUM: 4.1 mmol/L (ref 3.5–5.1)
PROTEIN TOTAL: 7 g/dL (ref 6.4–8.9)
SODIUM: 137 mmol/L (ref 136–145)

## 2022-10-26 LAB — CBC WITH DIFF
BASOPHIL #: 0.1 10*3/uL (ref 0.00–0.10)
BASOPHIL %: 1 % (ref 0–1)
EOSINOPHIL #: 0.1 10*3/uL (ref 0.00–0.50)
EOSINOPHIL %: 1 %
HCT: 45.2 % (ref 36.7–47.1)
HGB: 15.1 g/dL (ref 12.5–16.3)
LYMPHOCYTE #: 3.3 10*3/uL — ABNORMAL HIGH (ref 1.00–3.00)
LYMPHOCYTE %: 26 % (ref 16–44)
MCH: 28 pg (ref 23.8–33.4)
MCHC: 33.4 g/dL (ref 32.5–36.3)
MCV: 83.9 fL (ref 73.0–96.2)
MONOCYTE #: 0.9 10*3/uL (ref 0.30–1.00)
MONOCYTE %: 7 % (ref 5–13)
MPV: 8.5 fL (ref 7.4–11.4)
NEUTROPHIL #: 8.4 10*3/uL — ABNORMAL HIGH (ref 1.85–7.80)
NEUTROPHIL %: 65 % (ref 43–77)
PLATELETS: 287 10*3/uL (ref 140–440)
RBC: 5.39 10*6/uL (ref 4.06–5.63)
RDW: 14 % (ref 12.1–16.2)
WBC: 12.8 10*3/uL — ABNORMAL HIGH (ref 3.6–10.2)

## 2022-10-26 LAB — C-REACTIVE PROTEIN(CRP),INFLAMMATION: C-REACTIVE PROTEIN (CRP): 0.7 mg/dL — ABNORMAL HIGH (ref 0.1–0.5)

## 2022-10-26 LAB — COVID-19, FLU A/B, RSV RAPID BY PCR
INFLUENZA VIRUS TYPE A: NOT DETECTED
INFLUENZA VIRUS TYPE B: NOT DETECTED
RESPIRATORY SYNCTIAL VIRUS (RSV): NOT DETECTED
SARS-CoV-2: NOT DETECTED

## 2022-10-26 LAB — LACTIC ACID LEVEL W/ REFLEX FOR LEVEL >2.0: LACTIC ACID: 1.4 mmol/L (ref 0.5–2.2)

## 2022-10-26 MED ORDER — IBUPROFEN 600 MG TABLET
600.0000 mg | ORAL_TABLET | Freq: Four times a day (QID) | ORAL | 0 refills | Status: AC | PRN
Start: 2022-10-26 — End: ?

## 2022-10-26 MED ORDER — IBUPROFEN 800 MG TABLET
800.0000 mg | ORAL_TABLET | ORAL | Status: AC
Start: 2022-10-26 — End: 2022-10-26
  Administered 2022-10-26: 800 mg via ORAL

## 2022-10-26 NOTE — Discharge Instructions (Addendum)
Take ibuprofen as needed for discomfort  Please cut back/stop vaping  Follow-up with your family doctor for recheck on Monday/Tuesday  Return to emergency room for any fever, shortness of breath, worsening pain, or any concerns

## 2022-10-31 LAB — ADULT ROUTINE BLOOD CULTURE, SET OF 2 BOTTLES (BACTERIA AND YEAST)
BLOOD CULTURE, ROUTINE: NO GROWTH
BLOOD CULTURE, ROUTINE: NO GROWTH

## 2024-05-20 ENCOUNTER — Telehealth: Admitting: Nurse Practitioner

## 2024-05-20 ENCOUNTER — Other Ambulatory Visit (HOSPITAL_COMMUNITY): Payer: Self-pay

## 2024-05-20 DIAGNOSIS — J40 Bronchitis, not specified as acute or chronic: Secondary | ICD-10-CM

## 2024-05-20 MED ORDER — AMOXICILLIN 500 MG PO CAPS
500.0000 mg | ORAL_CAPSULE | Freq: Three times a day (TID) | ORAL | 0 refills | Status: AC
  Filled 2024-05-20: qty 30, 10d supply, fill #0

## 2024-05-20 MED ORDER — FLUTICASONE PROPIONATE 50 MCG/ACT NA SUSP
2.0000 | Freq: Every day | NASAL | 6 refills | Status: AC
  Filled 2024-05-20: qty 16, 30d supply, fill #0

## 2024-05-20 MED ORDER — PREDNISONE 20 MG PO TABS
20.0000 mg | ORAL_TABLET | Freq: Two times a day (BID) | ORAL | 0 refills | Status: AC
  Filled 2024-05-20: qty 10, 5d supply, fill #0

## 2024-05-20 NOTE — Progress Notes (Unsigned)
 Acute Video Visit    Virtual Visit Consent:   Kristopher Howell, you are scheduled for a virtual visit with a [facility] provider today.     Just as with appointments in the office, your consent must be obtained to participate.  Your consent will be active for this visit and any virtual visit you may have with one of our providers in the next 365 days.     If you have a MyChart account, a copy of this consent can be sent to you electronically.  All virtual visits are billed to your insurance company just like a traditional visit in the office.    If the connection with a video visit is poor, the visit may have to be switched to a telephone visit.  With either a video or telephone visit, we are not always able to ensure that we have a secure connection.     I need to obtain your verbal consent now.   Are you willing to proceed with your visit today?    Kristopher Howell has provided verbal consent on 05/20/2024 for a virtual visit (video or telephone).   Kristopher Kitty, FNP  Date: 05/20/2024 2:14 PM  Subjective:     Patient ID: Kristopher Howell, male    DOB: 1991/03/07, 33 y.o.   MRN: [id number]  Kristopher Howell Kristopher Howell, connected with  Kristopher Howell  ([id number], Nov 29, 1990) on 05/20/24 at  2:20 PM EDT by a video-enabled telemedicine application and verified that I am speaking with the correct person using two identifiers.   Location: Patient: [name]  Provider: Virtual Visit Location Provider: Home Office Medicare Video Visit Reason: Patient is currently homeless and has no mode of transportation, his only option for care currently is at the [facility] where he has the assistance of a video visit with a provider    I discussed the limitations of evaluation and management by telemedicine and the availability of in person appointments. The patient expressed understanding and agreed to proceed.    No chief complaint on file.   HPI  Kristopher Howell is a 33 y.o. who identifies as  a male who was assigned male at birth, and is being seen today for productive cough has tried mucinex without relief   He is a patient at the [facility] was previously seeing Kristopher Howell and is waiting to transition to Kristopher Howell, [facility]ell.  Has had previous episodes of bronchitis requiring prednisone without a documented history of asthma   Feels similar today having worsening cough/ denies a history of asthma but does feel that he is wheezing with his cough at times      Objective:      Physical Exam Constitutional:      General: He is not in acute distress.    Appearance: Normal appearance.  HENT:     Nose: Congestion present.     Mouth/Throat:     Mouth: Mucous membranes are moist.  Pulmonary:     Effort: Pulmonary effort is normal.  Neurological:     Mental Status: He is alert and oriented to person, place, and time.  Psychiatric:        Mood and Affect: Mood normal.         Assessment & Plan:   1. Bronchitis (Primary)  - fluticasone (FLONASE) 50 MCG/ACT nasal spray; Place 2 sprays into both nostrils daily.  Dispense: 16 g; Refill: 6 - predniSONE (DELTASONE) 20 MG tablet; Take 1 tablet (20 mg total)  by mouth 2 (two) times daily with a meal for 5 days.  Dispense: 10 tablet; Refill: 0 - amoxicillin (AMOXIL) 500 MG capsule; Take 1 capsule (500 mg total) by mouth 3 (three) times daily for 10 days.  Dispense: 30 capsule; Refill: 0   Follow Up Instructions: I discussed the assessment and treatment plan with the patient. The patient was provided an opportunity to ask questions and all were answered. The patient agreed with the plan and demonstrated an understanding of the instructions.  A copy of instructions were sent to the patient via MyChart unless otherwise noted below.    The patient was advised to call back or seek an in-person evaluation if the symptoms worsen or if the condition fails to improve as anticipated.    Kristopher Kitty, FNP  **Disclaimer: This note may  have been dictated with voice recognition software. Similar sounding words can inadvertently be transcribed and this note may contain transcription errors which may not have been corrected upon publication of note.**

## 2024-05-26 ENCOUNTER — Telehealth: Admitting: Nurse Practitioner

## 2024-05-26 ENCOUNTER — Other Ambulatory Visit (HOSPITAL_COMMUNITY): Payer: Self-pay

## 2024-05-26 VITALS — BP 150/92

## 2024-05-26 DIAGNOSIS — I10 Essential (primary) hypertension: Secondary | ICD-10-CM | POA: Diagnosis not present

## 2024-05-26 MED ORDER — LISINOPRIL 40 MG PO TABS
40.0000 mg | ORAL_TABLET | Freq: Every day | ORAL | 0 refills | Status: AC
  Filled 2024-05-26: qty 90, 90d supply, fill #0

## 2024-05-26 NOTE — Progress Notes (Signed)
 Acute Video Visit    Virtual Visit Consent:   Kristopher Howell, you are scheduled for a virtual visit with a [facility] provider today.     Just as with appointments in the office, your consent must be obtained to participate.  Your consent will be active for this visit and any virtual visit you may have with one of our providers in the next 365 days.     If you have a MyChart account, a copy of this consent can be sent to you electronically.  All virtual visits are billed to your insurance company just like a traditional visit in the office.    If the connection with a video visit is poor, the visit may have to be switched to a telephone visit.  With either a video or telephone visit, we are not always able to ensure that we have a secure connection.     I need to obtain your verbal consent now.   Are you willing to proceed with your visit today?    Kristopher Howell has provided verbal consent on 05/26/2024 for a virtual visit (video or telephone).  Need for virtual due to lack of transportation, high needs patient, homeless at [facility]    Lauraine Kitty, FNP  Date: 05/26/2024 1:05 PM  Subjective:     Patient ID: Kristopher Howell, male    DOB: 1990/12/22, 33 y.o.   MRN: [id number]  LILLETTE Lauraine Kitty, connected with  LOVETT COFFIN  ([id number], 1990/09/30) on 05/26/24 at  2:15 PM EDT by a video-enabled telemedicine application and verified that I am speaking with the correct person using two identifiers.   Location: Patient: [facility]  Provider: Virtual Visit Location Provider: Home Office   I discussed the limitations of evaluation and management by telemedicine and the availability of in person appointments. The patient expressed understanding and agreed to proceed.      HPI  Kristopher Howell is a 33 y.o. who identifies as a male who was assigned male at birth, and is being seen today for medication refill assistance.   He was seen last week for bronchitis, and is  feeling much improved.  He currently receives care at the [facility] because he is homeless and without transportation to another office at this time for acute needs.   He has seen Ronal Caldron Placey in the past, and is awaiting a new PCP appointment next month.   Received medical support and SDOH needs at [facility]   Medical history is significant for HTN and has been managed on lisinopril in the past without SE. Last refill from [facility] was 2021, he was since out of state some and presents with his most recent bottle for refill today that is 40mg  daily of lisinopril. He has one tablet lett. Denies swelling in lower extremities         Objective:     Physical Exam Constitutional:      General: He is not in acute distress.    Appearance: Normal appearance. He is not ill-appearing.  HENT:     Nose: Nose normal.     Mouth/Throat:     Mouth: Mucous membranes are moist.  Pulmonary:     Effort: Pulmonary effort is normal.  Neurological:     Mental Status: He is alert and oriented to person, place, and time.  Psychiatric:        Mood and Affect: Mood normal.    Vitals:   05/26/24 1313  BP: ROLLEN)  150/92         Assessment & Plan:   1. Essential hypertension (Primary) Follow up for BP check in 1-2 weeks earlier with any new complaints  May consider adding hydrochlorothiazide if BP remains elevated at follow up   - lisinopril (ZESTRIL) 40 MG tablet; Take 1 tablet (40 mg total) by mouth daily.  Dispense: 90 tablet; Refill: 0    Offered labs at [facility] while awaiting additional services at [facility] he will consider   Follow Up Instructions: I discussed the assessment and treatment plan with the patient. The patient was provided an opportunity to ask questions and all were answered. The patient agreed with the plan and demonstrated an understanding of the instructions.  A copy of instructions were sent to the patient via MyChart unless otherwise noted below.     The patient was advised to call back or  seek an in-person evaluation if the symptoms worsen or if the condition fails to improve as anticipated.    Lauraine Kitty, FNP  **Disclaimer: This note may have been dictated with voice recognition software. Similar sounding words can inadvertently be transcribed and this note may contain transcription errors which may not have been corrected upon publication of note.**

## 2024-06-04 ENCOUNTER — Other Ambulatory Visit (HOSPITAL_COMMUNITY): Payer: Self-pay

## 2024-08-18 NOTE — Progress Notes (Unsigned)
 The patient presented for a primary care visit on 05/26/2024. His vitals, labs, and SDOH screening were not conducted due to this being a video visit.   A review of the patient's chart revealed that they do not currently have a primary care provider, he was encouraged to do labs at [facility] and consider other services through the [facility]. Visit notes indicates that the pt is homeless. The pt has Medicare for his insurance. Smoking and SDOH status is not indicated at this time.   Call Attempt #1: [facility] called the pt and the number is currently not in service.   Call Attempt 2: CHW called pt again. This number is currently not in service.   Call Attempt #3: CHW called pt for third attempt and unable to leave a vm due to the number no longer being in service.  CHW unable to send letter to the pt due to visit notes indicating that the pt is currently homeless and to verify with the pt their most up to date mailing address if applicable.   An additional follow up will be done according to the health equity team's protocol.
